# Patient Record
Sex: Female | Born: 1990 | Race: White | Hispanic: No | Smoking: Current some day smoker
Health system: Southern US, Academic
[De-identification: ages and names within clinical notes are randomized; demographics above are authoritative.]

## PROBLEM LIST (undated history)

## (undated) DIAGNOSIS — J020 Streptococcal pharyngitis: Secondary | ICD-10-CM

## (undated) DIAGNOSIS — F32A Depression, unspecified: Secondary | ICD-10-CM

## (undated) DIAGNOSIS — K589 Irritable bowel syndrome without diarrhea: Secondary | ICD-10-CM

## (undated) DIAGNOSIS — F329 Major depressive disorder, single episode, unspecified: Secondary | ICD-10-CM

## (undated) HISTORY — DX: Depression, unspecified: F32.A

## (undated) HISTORY — DX: Major depressive disorder, single episode, unspecified: F32.9

## (undated) HISTORY — PX: COLONOSCOPY: SHX174

## (undated) HISTORY — PX: CHOLECYSTECTOMY: SHX55

## (undated) HISTORY — DX: Irritable bowel syndrome, unspecified: K58.9

---
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
## 2010-12-15 ENCOUNTER — Emergency Department (HOSPITAL_COMMUNITY): Payer: Self-pay | Admitting: FAMILY PRACTICE

## 2014-08-13 ENCOUNTER — Ambulatory Visit (HOSPITAL_COMMUNITY): Payer: Self-pay | Admitting: Family

## 2014-08-17 ENCOUNTER — Other Ambulatory Visit: Payer: Self-pay

## 2014-08-18 LAB — HISTORICAL SURGICAL PATHOLOGY SPECIMEN

## 2015-02-06 ENCOUNTER — Emergency Department (HOSPITAL_COMMUNITY): Payer: Self-pay | Admitting: Emergency Medicine

## 2017-01-09 ENCOUNTER — Emergency Department
Admission: EM | Admit: 2017-01-09 | Discharge: 2017-01-09 | Disposition: A | Discharge status: Home / Self Care | Payer: Self-pay | Attending: Emergency Medicine | Admitting: Emergency Medicine

## 2017-01-09 ENCOUNTER — Encounter: Payer: Self-pay | Admitting: Emergency Medicine

## 2017-01-09 DIAGNOSIS — L089 Local infection of the skin and subcutaneous tissue, unspecified: Secondary | ICD-10-CM | POA: Insufficient documentation

## 2017-01-09 DIAGNOSIS — F17200 Nicotine dependence, unspecified, uncomplicated: Secondary | ICD-10-CM | POA: Insufficient documentation

## 2017-01-09 DIAGNOSIS — J069 Acute upper respiratory infection, unspecified: Secondary | ICD-10-CM | POA: Insufficient documentation

## 2017-01-09 MED ORDER — CEPHALEXIN 500 MG PO CAPS: 500.0000 mg | ORAL_CAPSULE | Freq: Three times a day (TID) | ORAL | 0 refills | Status: DC

## 2017-01-09 MED ORDER — PSEUDOEPH-BROMPHEN-DM 30-2-10 MG/5ML PO SYRP: 5.0000 mL | ORAL_SOLUTION | Freq: Four times a day (QID) | ORAL | 0 refills | Status: DC | PRN

## 2017-01-09 NOTE — ED Triage Notes (Signed)
Pt with cough for three days and all over body aches.

## 2017-01-09 NOTE — ED Notes (Signed)
See triage note  States she developed some body aches   Sore throat ,cough and fever/chills 3 days ago  Unsure of how high her fever was at home but low grade on arrival   Also states cough has been productive at times. States she took some Nyquil 2 days ago and was able to sleep but no relief with Tylenol sinus

## 2017-01-09 NOTE — ED Provider Notes (Signed)
[facility] Emergency Department Provider Note  ___________________________________________   First MD Initiated Contact with Patient 01/09/17 [phone]     (approximate)  I have reviewed the triage vital signs and the nursing notes.   HISTORY  Chief Complaint Cough   HPI Sara Berger is a 26 y.o. female is here with complaintof cough for the last 3 days and generalized body aches. Patient states that she has not actually taken her temperature at home but has felt feverish. She denies any nausea or vomiting. She has had some ear pain and nasal congestion. Cough is nonproductive. She is also here to be seen for her tattoo that is "infected". Evette Doffingattoo is 786 days old and has been draining. Patient also smokes daily.   History reviewed. No pertinent past medical history.  There are no active problems to display for this patient.   Past Surgical History:  Procedure Laterality Date  . CHOLECYSTECTOMY      Prior to Admission medications   Medication Sig Start Date End Date Taking? Authorizing Provider  brompheniramine-pseudoephedrine-DM 30-2-10 MG/5ML syrup Take 5 mLs by mouth 4 (four) times daily as needed. 01/09/17   Tommi RumpsSummers, Rhonda L, PA-C  cephALEXin (KEFLEX) 500 MG capsule Take 1 capsule (500 mg total) by mouth 3 (three) times daily. 01/09/17   Tommi RumpsSummers, Rhonda L, PA-C    Allergies Penicillins  No family history on file.  Social History Social History  Substance Use Topics  . Smoking status: Current Every Day Smoker  . Smokeless tobacco: Never Used  . Alcohol use No    Review of Systems Constitutional: Subjective fever/chills ENT: No sore throat. Positive nasal congestion. Cardiovascular: Denies chest pain. Respiratory: Denies shortness of breath. Positive nonproductive cough. Musculoskeletal: Negative for back pain. Skin: Skin infection left arm. Neurological: Negative for headaches, focal weakness or  numbness.   ____________________________________________   PHYSICAL EXAM:  VITAL SIGNS: ED Triage Vitals [01/09/17 0901]  Enc Vitals Group     BP      Pulse Rate 71     Resp 20     Temp 99 F (37.2 C)     Temp Source Oral     SpO2 100 %     Weight      Height      Head Circumference      Peak Flow      Pain Score      Pain Loc      Pain Edu?      Excl. in GC?    Constitutional: Alert and oriented. Well appearing and in no acute distress. Eyes: Conjunctivae are normal. Head: Atraumatic. Nose: Mild nasal congestion present. Mouth/Throat: Mucous membranes are moist.  Oropharynx non-erythematous. Mild posterior drainage noted. Neck: No stridor.   Hematological/Lymphatic/Immunilogical: No cervical lymphadenopathy. Cardiovascular: Normal rate, regular rhythm. Grossly normal heart sounds.  Good peripheral circulation. Respiratory: Normal respiratory effort.  No retractions. Lungs CTAB. Musculoskeletal: Moves upper and lower extremities without any difficulty. Normal gait was noted. Neurologic:  Normal speech and language. No gross focal neurologic deficits are appreciated.  Skin:  Skin is warm, dry.  Left arm medial aspect there is a tattoo present with 2 open areas that are draining.  No erythema or warmth was noted. Minimal tenderness. Psychiatric: Mood and affect are normal. Speech and behavior are normal.  ____________________________________________   LABS (all labs ordered are listed, but only abnormal results are displayed)  Labs Reviewed - No data to display  PROCEDURES  Procedure(s) performed: None  Procedures  Critical Care performed: No  ____________________________________________   INITIAL IMPRESSION / ASSESSMENT AND PLAN / ED COURSE  Pertinent labs & imaging results that were available during my care of the patient were reviewed by me and considered in my medical decision making (see chart for details).  Patient has taken Keflex before without  any difficulty or reactions. Patient was given a prescription for Keflex for her skin infection at her tattoo site. She is also given prescription for Bromfed-DM as needed for cough and congestion. She is to increase fluids. She was encouraged to decrease smoking. She is to [facility] if any continued problems.      ____________________________________________   FINAL CLINICAL IMPRESSION(S) / ED DIAGNOSES  Final diagnoses:  Acute upper respiratory infection  Skin infection      NEW MEDICATIONS STARTED DURING THIS VISIT:  Discharge Medication List as of 01/09/2017  9:30 AM    START taking these medications   Details  brompheniramine-pseudoephedrine-DM 30-2-10 MG/5ML syrup Take 5 mLs by mouth 4 (four) times daily as needed., Starting Tue 01/09/2017, Print    cephALEXin (KEFLEX) 500 MG capsule Take 1 capsule (500 mg total) by mouth 3 (three) times daily., Starting Tue 01/09/2017, Print         Note:  This document was prepared using Dragon voice recognition software and may include unintentional dictation errors.    Tommi RumpsSummers, Rhonda L, PA-C 01/09/17 1054    Jeanmarie PlantMcShane, James A, MD 01/09/17 [phone]

## 2017-01-09 NOTE — ED Notes (Signed)
First Nurse Note:  Patient given mask and box of tissues.

## 2017-01-09 NOTE — Discharge Instructions (Signed)
You may also apply a small amount of Neosporin to the tattoo area twice a day. Begin taking Bromfed DM as directed for cough and congestion. Increase fluids. Decrease smoking. Begin taking Keflex 3 times a day for 7 days. May take Tylenol or ibuprofen if needed for pain. [facility] if any continued problems.

## 2017-06-20 ENCOUNTER — Emergency Department
Admission: EM | Admit: 2017-06-20 | Discharge: 2017-06-20 | Disposition: A | Discharge status: Home / Self Care | Payer: Self-pay | Attending: Emergency Medicine | Admitting: Emergency Medicine

## 2017-06-20 ENCOUNTER — Encounter: Payer: Self-pay | Admitting: Emergency Medicine

## 2017-06-20 DIAGNOSIS — R300 Dysuria: Secondary | ICD-10-CM | POA: Insufficient documentation

## 2017-06-20 DIAGNOSIS — F17200 Nicotine dependence, unspecified, uncomplicated: Secondary | ICD-10-CM | POA: Insufficient documentation

## 2017-06-20 DIAGNOSIS — M5441 Lumbago with sciatica, right side: Secondary | ICD-10-CM | POA: Insufficient documentation

## 2017-06-20 DIAGNOSIS — M5431 Sciatica, right side: Secondary | ICD-10-CM

## 2017-06-20 LAB — CBC
HCT: 38.9 % (ref 35.0–47.0)
Hemoglobin: 13.3 g/dL (ref 12.0–16.0)
MCH: 32.4 pg (ref 26.0–34.0)
MCHC: 34.3 g/dL (ref 32.0–36.0)
MCV: 94.6 fL (ref 80.0–100.0)
PLATELETS: 299 10*3/uL (ref 150–440)
RBC: 4.11 MIL/uL (ref 3.80–5.20)
RDW: 12.2 % (ref 11.5–14.5)
WBC: 8.9 10*3/uL (ref 3.6–11.0)

## 2017-06-20 LAB — COMPREHENSIVE METABOLIC PANEL
ALK PHOS: 66 U/L (ref 38–126)
ALT: 12 U/L — AB (ref 14–54)
AST: 20 U/L (ref 15–41)
Albumin: 4.5 g/dL (ref 3.5–5.0)
Anion gap: 6 (ref 5–15)
BUN: 14 mg/dL (ref 6–20)
CHLORIDE: 107 mmol/L (ref 101–111)
CO2: 26 mmol/L (ref 22–32)
CREATININE: 0.68 mg/dL (ref 0.44–1.00)
Calcium: 9.2 mg/dL (ref 8.9–10.3)
GFR calc Af Amer: >60 mL/min (ref >60)
GFR calc non Af Amer: >60 mL/min (ref >60)
Glucose, Bld: 91 mg/dL (ref 65–99)
Potassium: 3.7 mmol/L (ref 3.5–5.1)
Sodium: 139 mmol/L (ref 135–145)
Total Bilirubin: 1.4 mg/dL — ABNORMAL HIGH (ref 0.3–1.2)
Total Protein: 7.4 g/dL (ref 6.5–8.1)

## 2017-06-20 LAB — URINALYSIS, COMPLETE (UACMP) WITH MICROSCOPIC
Bacteria, UA: NONE SEEN
Bilirubin Urine: NEGATIVE
Glucose, UA: NEGATIVE mg/dL
Hgb urine dipstick: NEGATIVE
Ketones, ur: NEGATIVE mg/dL
Leukocytes, UA: NEGATIVE
Nitrite: NEGATIVE
PH: 6 (ref 5.0–8.0)
Protein, ur: NEGATIVE mg/dL
SPECIFIC GRAVITY, URINE: 1.017 (ref 1.005–1.030)

## 2017-06-20 LAB — LIPASE, BLOOD: LIPASE: 30 U/L (ref 11–51)

## 2017-06-20 LAB — POCT PREGNANCY, URINE: Preg Test, Ur: NEGATIVE

## 2017-06-20 MED ORDER — PREDNISONE 10 MG (21) PO TBPK: ORAL_TABLET | ORAL | 0 refills | Status: DC

## 2017-06-20 MED ORDER — PHENAZOPYRIDINE HCL 100 MG PO TABS: 100.0000 mg | ORAL_TABLET | Freq: Three times a day (TID) | ORAL | 0 refills | Status: AC | PRN

## 2017-06-20 NOTE — ED Provider Notes (Signed)
[facility] Emergency Department Provider Note   ____________________________________________   I have reviewed the triage vital signs and the nursing notes.   HISTORY  Chief Complaint Dysuria and Back Pain   History limited by: Not Limited   HPI Sara Berger is a 27 y.o. female who presents to the emergency department today with primary complain for dysuria.  DURATION:roughly 2 weeks TIMING: constant SEVERITY: severe QUALITY: pain CONTEXT: patient states she has a history of kidney stones. States that this pain feels similar to her. Was hoping that she would pass it and it would go away but the pain persisted. Pain actually better today. Was more severe yesterday. MODIFYING FACTORS: none ASSOCIATED SYMPTOMS: right low back pain. Denies any fevers. Denies bad odor to her urine.   Per medical record review patient has a history of being seen in the emergency department for URI.  History reviewed. No pertinent past medical history.  There are no active problems to display for this patient.   Past Surgical History:  Procedure Laterality Date  . CHOLECYSTECTOMY      Prior to Admission medications   Medication Sig Start Date End Date Taking? Authorizing Provider  brompheniramine-pseudoephedrine-DM 30-2-10 MG/5ML syrup Take 5 mLs by mouth 4 (four) times daily as needed. 01/09/17   Tommi RumpsSummers, Rhonda L, PA-C  cephALEXin (KEFLEX) 500 MG capsule Take 1 capsule (500 mg total) by mouth 3 (three) times daily. 01/09/17   Tommi RumpsSummers, Rhonda L, PA-C    Allergies Penicillins  History reviewed. No pertinent family history.  Social History Social History   Tobacco Use  . Smoking status: Current Every Day Smoker  . Smokeless tobacco: Never Used  Substance Use Topics  . Alcohol use: No  . Drug use: No    Review of Systems Constitutional: No fever/chills Eyes: No visual changes. ENT: No sore throat. Cardiovascular: Denies chest pain. Respiratory:  Denies shortness of breath. Gastrointestinal: No abdominal pain.  No nausea, no vomiting.  No diarrhea.   Genitourinary: Positive for dysuria. Musculoskeletal: Positive for right lower back pain. Skin: Negative for rash. Neurological: Negative for headaches, focal weakness or numbness.  ____________________________________________   PHYSICAL EXAM:  VITAL SIGNS: ED Triage Vitals  Enc Vitals Group     BP 06/20/17 2022 124/68     Pulse Rate 06/20/17 2022 68     Resp 06/20/17 2022 17     Temp 06/20/17 2022 97.9 F (36.6 C)     Temp Source 06/20/17 2022 Oral     SpO2 06/20/17 2022 100 %     Weight 06/20/17 2023 125 lb (56.7 kg)     Height --      Head Circumference --      Peak Flow --      Pain Score 06/20/17 2038 8   Constitutional: Alert and oriented. Well appearing and in no distress. Eyes: Conjunctivae are normal.  ENT   Head: Normocephalic and atraumatic.   Nose: No congestion/rhinnorhea.   Mouth/Throat: Mucous membranes are moist.   Neck: No stridor. Hematological/Lymphatic/Immunilogical: No cervical lymphadenopathy. Cardiovascular: Normal rate, regular rhythm.  No murmurs, rubs, or gallops.  Respiratory: Normal respiratory effort without tachypnea nor retractions. Breath sounds are clear and equal bilaterally. No wheezes/rales/rhonchi. Gastrointestinal: Soft and non tender. No rebound. No guarding.  Genitourinary: Deferred Musculoskeletal: Normal range of motion in all extremities. No lower extremity edema. Tender to palpation over the right SI joint.  Neurologic:  Normal speech and language. No gross focal neurologic deficits are appreciated.  Skin:  Skin is warm, dry and intact. No rash noted. Psychiatric: Mood and affect are normal. Speech and behavior are normal. Patient exhibits appropriate insight and judgment.  ____________________________________________    LABS (pertinent positives/negatives)  upreg negative Lipase 30 cmp wnl except alt 12,  t bili 1.4 CBC wnl  UA not consistent with infection ____________________________________________   EKG  None  ____________________________________________    RADIOLOGY  None  ____________________________________________   PROCEDURES  Procedures  ____________________________________________   INITIAL IMPRESSION / ASSESSMENT AND PLAN / ED COURSE  Pertinent labs & imaging results that were available during my care of the patient were reviewed by me and considered in my medical decision making (see chart for details).  Patient presents to the emergency department today with primary concern for dysuria.  Patient states that she has history of kidney stones.  Actually states that the pain is somewhat better today so I do wonder if perhaps she is ready passed kidney stone and now she is having some residual discomfort.  No obvious signs of infection in the urine.  Blood work without concerning findings.  She is somewhat tender over her SI/sciatica.  Will plan on giving patient Pyridium for the urinary complaints and steroids for the back pain.  Will give patient primary care follow-up.  Discussed plan and findings with patient.  ____________________________________________   FINAL CLINICAL IMPRESSION(S) / ED DIAGNOSES  Final diagnoses:  Dysuria  Sciatica of right side     Note: This dictation was prepared with Dragon dictation. Any transcriptional errors that result from this process are unintentional     Phineas Semen, MD 06/20/17 2242

## 2017-06-20 NOTE — ED Notes (Signed)
Pt c/o lower back pain - pt states it feels like a pinched nerve - pain is located on top of right buttocks Pt also reports hx of kidney stone and she feels like she has another kidney stone - reports the pain is like "peeing razor blades" - pt reports she has had this pain for 3 weeks - pain is on left side and radiates into lower abd

## 2017-06-20 NOTE — ED Triage Notes (Signed)
Pt c/o urinary frequency, dysuria and lower abdominal pain as well as pain in lower right back, x3 weeks.

## 2017-06-20 NOTE — Discharge Instructions (Signed)
Please seek medical attention for any high fevers, chest pain, shortness of breath, change in behavior, persistent vomiting, bloody stool or any other new or concerning symptoms.  

## 2017-06-22 LAB — URINE CULTURE

## 2017-07-28 ENCOUNTER — Other Ambulatory Visit: Payer: Self-pay

## 2017-07-28 ENCOUNTER — Emergency Department
Admission: EM | Admit: 2017-07-28 | Discharge: 2017-07-28 | Disposition: A | Discharge status: Home / Self Care | Payer: Self-pay | Attending: Student in an Organized Health Care Education/Training Program | Admitting: Student in an Organized Health Care Education/Training Program

## 2017-07-28 ENCOUNTER — Encounter: Payer: Self-pay | Admitting: Emergency Medicine

## 2017-07-28 DIAGNOSIS — Z79899 Other long term (current) drug therapy: Secondary | ICD-10-CM | POA: Insufficient documentation

## 2017-07-28 DIAGNOSIS — F17210 Nicotine dependence, cigarettes, uncomplicated: Secondary | ICD-10-CM | POA: Insufficient documentation

## 2017-07-28 DIAGNOSIS — M5431 Sciatica, right side: Secondary | ICD-10-CM | POA: Insufficient documentation

## 2017-07-28 MED ORDER — CYCLOBENZAPRINE HCL 5 MG PO TABS: 5.0000 mg | ORAL_TABLET | Freq: Three times a day (TID) | ORAL | 0 refills | Status: AC | PRN

## 2017-07-28 MED ORDER — CYCLOBENZAPRINE HCL 10 MG PO TABS
10.0000 mg | ORAL_TABLET | Freq: Once | ORAL | Status: AC
  Administered 2017-07-28: 10 mg via ORAL
  Filled 2017-07-28: qty 1

## 2017-07-28 MED ORDER — KETOROLAC TROMETHAMINE 30 MG/ML IJ SOLN
30.0000 mg | Freq: Once | INTRAMUSCULAR | Status: AC
  Administered 2017-07-28: 30 mg via INTRAMUSCULAR
  Filled 2017-07-28: qty 1

## 2017-07-28 MED ORDER — PREDNISONE 50 MG PO TABS: ORAL_TABLET | ORAL | 0 refills | Status: DC

## 2017-07-28 NOTE — ED Provider Notes (Signed)
[facility] Emergency Department Provider Note  ____________________________________________  Time seen: Approximately 6:15 PM  I have reviewed the triage vital signs and the nursing notes.   HISTORY  Chief Complaint Hip Pain    HPI Sara Berger is a 27 y.o. female presents to the emergency department with radiculopathy of the right lower extremity that patient has experienced intermittently for the past 2 months.  Patient reports that she previously tried tapered prednisone, which provided her with momentary relief.  Patient has not been exercising daily or performing recommended stretches.  She denies weakness or bowel or bladder incontinence.  Patient has been able to ambulate with some difficulty.  Patient symptoms are not keeping her from work.  She rates her pain at 10 out of 10 in intensity.  Patient has been taking ibuprofen 800 daily.   History reviewed. No pertinent past medical history.  There are no active problems to display for this patient.   Past Surgical History:  Procedure Laterality Date  . CHOLECYSTECTOMY      Prior to Admission medications   Medication Sig Start Date End Date Taking? Authorizing Provider  brompheniramine-pseudoephedrine-DM 30-2-10 MG/5ML syrup Take 5 mLs by mouth 4 (four) times daily as needed. 01/09/17   Tommi Rumps, PA-C  cephALEXin (KEFLEX) 500 MG capsule Take 1 capsule (500 mg total) by mouth 3 (three) times daily. 01/09/17   Tommi Rumps, PA-C  cyclobenzaprine (FLEXERIL) 5 MG tablet Take 1 tablet (5 mg total) by mouth 3 (three) times daily as needed for up to 3 days for muscle spasms. 07/28/17 07/31/17  Orvil Feil, PA-C  phenazopyridine (PYRIDIUM) 100 MG tablet Take 1 tablet (100 mg total) by mouth 3 (three) times daily as needed for pain. 06/20/17 06/20/18  Phineas Semen, MD  predniSONE (DELTASONE) 50 MG tablet Take one 50 mg tablet once daily for 5 days. 07/28/17   Orvil Feil, PA-C     Allergies Penicillins  No family history on file.  Social History Social History   Tobacco Use  . Smoking status: Current Every Day Smoker  . Smokeless tobacco: Never Used  Substance Use Topics  . Alcohol use: No  . Drug use: No     Review of Systems  Constitutional: No fever/chills Eyes: No visual changes. No discharge ENT: No upper respiratory complaints. Cardiovascular: no chest pain. Respiratory: no cough. No SOB. Gastrointestinal: No abdominal pain.  No nausea, no vomiting.  No diarrhea.  No constipation. Musculoskeletal: Negative for musculoskeletal pain. Skin: Negative for rash, abrasions, lacerations, ecchymosis. Neurological: Patient has radiculopathy of right lower extremity.   ____________________________________________   PHYSICAL EXAM:  VITAL SIGNS: ED Triage Vitals  Enc Vitals Group     BP 07/28/17 1609 (!) 119/56     Pulse Rate 07/28/17 1609 67     Resp 07/28/17 1609 18     Temp 07/28/17 1609 98.8 F (37.1 C)     Temp Source 07/28/17 1609 Oral     SpO2 07/28/17 1609 100 %     Weight 07/28/17 1612 130 lb (59 kg)     Height 07/28/17 1612 5\' 5"  (1.651 m)     Head Circumference --      Peak Flow --      Pain Score 07/28/17 1612 8     Pain Loc --      Pain Edu? --      Excl. in GC? --      Constitutional: Alert and oriented. Well appearing and in  no acute distress. Eyes: Conjunctivae are normal. PERRL. EOMI. Head: Atraumatic. Cardiovascular: Normal rate, regular rhythm. Normal S1 and S2.  Good peripheral circulation. Respiratory: Normal respiratory effort without tachypnea or retractions. Lungs CTAB. Good air entry to the bases with no decreased or absent breath sounds. Gastrointestinal: Bowel sounds 4 quadrants. Soft and nontender to palpation. No guarding or rigidity. No palpable masses. No distention. No CVA tenderness. Musculoskeletal: Full range of motion to all extremities. No gross deformities appreciated.  Positive straight leg  raise, right. Neurologic:  Normal speech and language. No gross focal neurologic deficits are appreciated.  Skin:  Skin is warm, dry and intact. No rash noted. Psychiatric: Mood and affect are normal. Speech and behavior are normal. Patient exhibits appropriate insight and judgement.   ____________________________________________   LABS (all labs ordered are listed, but only abnormal results are displayed)  Labs Reviewed - No data to display ____________________________________________  EKG   ____________________________________________  RADIOLOGY   No results found.  ____________________________________________    PROCEDURES  Procedure(s) performed:    Procedures    Medications  ketorolac (TORADOL) 30 MG/ML injection 30 mg (30 mg Intramuscular Given 07/28/17 1801)  cyclobenzaprine (FLEXERIL) tablet 10 mg (10 mg Oral Given 07/28/17 1800)     ____________________________________________   INITIAL IMPRESSION / ASSESSMENT AND PLAN / ED COURSE  Pertinent labs & imaging results that were available during my care of the patient were reviewed by me and considered in my medical decision making (see chart for details).  Review of the [place] CSRS was performed in accordance of the [facility] prior to dispensing any controlled drugs.     Assessment and plan Radiculopathy Sciatica Differential diagnosis originally included sciatica, radiculopathy, lumbar strain and herniated disc.  Patient had a positive straight leg raise test on physical exam.  Patient was given Toradol and Flexeril in the emergency department.  I believe that patient did not receive a therapeutic dose of prednisone.  Patient was discharged with prednisone at 50 mg for the next 5 days.  Patient was advised to walk daily and to perform daily stretches and/or a weekly yoga class.  Patient voiced understanding.  Patient was advised to follow-up with primary care as needed.  All patient questions were  answered.    ____________________________________________  FINAL CLINICAL IMPRESSION(S) / ED DIAGNOSES  Final diagnoses:  Sciatica of right side      NEW MEDICATIONS STARTED DURING THIS VISIT:  ED Discharge Orders        Ordered    predniSONE (DELTASONE) 50 MG tablet     07/28/17 1744    cyclobenzaprine (FLEXERIL) 5 MG tablet  3 times daily PRN     07/28/17 1746          This chart was dictated using voice recognition software/Dragon. Despite best efforts to proofread, errors can occur which can change the meaning. Any change was purely unintentional.    Orvil FeilWoods, Ameena Vesey M, PA-C 07/28/17 1818    Willy Eddyobinson, Patrick, MD 07/28/17 [phone]

## 2017-07-28 NOTE — ED Triage Notes (Signed)
R hip pain x 2 months. Worse in past week. Previously diagnosed as sciatica. Denies injury.

## 2017-07-28 NOTE — ED Notes (Signed)

## 2018-06-30 ENCOUNTER — Other Ambulatory Visit: Payer: Self-pay

## 2018-06-30 ENCOUNTER — Emergency Department
Admission: EM | Admit: 2018-06-30 | Discharge: 2018-06-30 | Disposition: A | Discharge status: Home / Self Care | Payer: Self-pay | Attending: Emergency Medicine | Admitting: Emergency Medicine

## 2018-06-30 ENCOUNTER — Emergency Department: Payer: Self-pay

## 2018-06-30 DIAGNOSIS — Z87891 Personal history of nicotine dependence: Secondary | ICD-10-CM | POA: Insufficient documentation

## 2018-06-30 DIAGNOSIS — Z79899 Other long term (current) drug therapy: Secondary | ICD-10-CM | POA: Insufficient documentation

## 2018-06-30 DIAGNOSIS — N83202 Unspecified ovarian cyst, left side: Secondary | ICD-10-CM | POA: Insufficient documentation

## 2018-06-30 DIAGNOSIS — R1010 Upper abdominal pain, unspecified: Secondary | ICD-10-CM

## 2018-06-30 DIAGNOSIS — N2889 Other specified disorders of kidney and ureter: Secondary | ICD-10-CM | POA: Insufficient documentation

## 2018-06-30 LAB — URINALYSIS, COMPLETE (UACMP) WITH MICROSCOPIC
BACTERIA UA: NONE SEEN
BILIRUBIN URINE: NEGATIVE
Glucose, UA: NEGATIVE mg/dL
Hgb urine dipstick: NEGATIVE
Ketones, ur: 80 mg/dL — AB
Leukocytes, UA: NEGATIVE
Nitrite: NEGATIVE
Protein, ur: NEGATIVE mg/dL
SPECIFIC GRAVITY, URINE: 1.023 (ref 1.005–1.030)
pH: 6 (ref 5.0–8.0)

## 2018-06-30 LAB — CBC
HCT: 38.5 % (ref 36.0–46.0)
Hemoglobin: 13.2 g/dL (ref 12.0–15.0)
MCH: 32.2 pg (ref 26.0–34.0)
MCHC: 34.3 g/dL (ref 30.0–36.0)
MCV: 93.9 fL (ref 80.0–100.0)
Platelets: 308 10*3/uL (ref 150–400)
RBC: 4.1 MIL/uL (ref 3.87–5.11)
RDW: 11.9 % (ref 11.5–15.5)
WBC: 7.6 10*3/uL (ref 4.0–10.5)
nRBC: 0 % (ref 0.0–0.2)

## 2018-06-30 LAB — COMPREHENSIVE METABOLIC PANEL
ALT: 11 U/L (ref 0–44)
AST: 17 U/L (ref 15–41)
Albumin: 4.6 g/dL (ref 3.5–5.0)
Alkaline Phosphatase: 62 U/L (ref 38–126)
Anion gap: 7 (ref 5–15)
BUN: 9 mg/dL (ref 6–20)
CHLORIDE: 109 mmol/L (ref 98–111)
CO2: 23 mmol/L (ref 22–32)
CREATININE: 0.55 mg/dL (ref 0.44–1.00)
Calcium: 9.1 mg/dL (ref 8.9–10.3)
GFR calc Af Amer: >60 mL/min (ref >60)
GLUCOSE: 94 mg/dL (ref 70–99)
Potassium: 3.7 mmol/L (ref 3.5–5.1)
SODIUM: 139 mmol/L (ref 135–145)
Total Bilirubin: 3.2 mg/dL — ABNORMAL HIGH (ref 0.3–1.2)
Total Protein: 7.3 g/dL (ref 6.5–8.1)

## 2018-06-30 LAB — LIPASE, BLOOD: LIPASE: 24 U/L (ref 11–51)

## 2018-06-30 LAB — POCT PREGNANCY, URINE: PREG TEST UR: NEGATIVE

## 2018-06-30 MED ORDER — SODIUM CHLORIDE 0.9 % IV BOLUS
1000.0000 mL | Freq: Once | INTRAVENOUS | Status: AC
  Administered 2018-06-30: 1000 mL via INTRAVENOUS

## 2018-06-30 MED ORDER — ONDANSETRON HCL 4 MG/2ML IJ SOLN
4.0000 mg | Freq: Once | INTRAMUSCULAR | Status: AC
  Administered 2018-06-30: 4 mg via INTRAVENOUS
  Filled 2018-06-30: qty 2

## 2018-06-30 MED ORDER — SODIUM CHLORIDE 0.9% FLUSH: 3.0000 mL | Freq: Once | INTRAVENOUS | Status: DC

## 2018-06-30 MED ORDER — IOHEXOL 300 MG/ML  SOLN
100.0000 mL | Freq: Once | INTRAMUSCULAR | Status: AC | PRN
  Administered 2018-06-30: 100 mL via INTRAVENOUS

## 2018-06-30 MED ORDER — PROMETHAZINE HCL 25 MG/ML IJ SOLN
12.5000 mg | Freq: Once | INTRAMUSCULAR | Status: AC
  Administered 2018-06-30: 12.5 mg via INTRAVENOUS
  Filled 2018-06-30: qty 1

## 2018-06-30 MED ORDER — OMEPRAZOLE 40 MG PO CPDR: 40.0000 mg | DELAYED_RELEASE_CAPSULE | Freq: Every day | ORAL | 0 refills | Status: DC

## 2018-06-30 MED ORDER — LIDOCAINE VISCOUS HCL 2 % MT SOLN
15.0000 mL | Freq: Once | OROMUCOSAL | Status: AC
Start: 2018-06-30 — End: 2018-06-30
  Administered 2018-06-30: 15 mL via OROMUCOSAL
  Filled 2018-06-30: qty 15

## 2018-06-30 MED ORDER — ALUM & MAG HYDROXIDE-SIMETH 200-200-20 MG/5ML PO SUSP
30.0000 mL | Freq: Once | ORAL | Status: AC
  Administered 2018-06-30: 30 mL via ORAL
  Filled 2018-06-30: qty 30

## 2018-06-30 NOTE — ED Provider Notes (Signed)
[facility] Emergency Department Provider Note  ____________________________________________   First MD Initiated Contact with Patient 06/30/18 1237     (approximate)  I have reviewed the triage vital signs and the nursing notes.   HISTORY  Chief Complaint Abdominal Pain   HPI Sara Berger is a 28 y.o. female with a history of cholecystectomy as well as gastric ulcers who was presented emergency department 2 to 3 days of epigastric abdominal pain which he describes a burning type pain.  Says that it is associated with nausea and vomiting.  Denies any diarrhea or abdominal distention.  States that she is unable to keep food down for approximately 10 minutes before vomiting.  Says has tried Pepto-Bismol without relief.   History reviewed. No pertinent past medical history.  There are no active problems to display for this patient.   Past Surgical History:  Procedure Laterality Date  . CHOLECYSTECTOMY    . COLONOSCOPY      Prior to Admission medications   Medication Sig Start Date End Date Taking? Authorizing Provider  lamoTRIgine (LAMICTAL) 25 MG tablet Take 25 mg by mouth 2 (two) times daily.  05/27/18  Yes [provider]    Allergies Penicillins  History reviewed. No pertinent family history.  Social History Social History   Tobacco Use  . Smoking status: Former Games developermoker  . Smokeless tobacco: Never Used  Substance Use Topics  . Alcohol use: Yes  . Drug use: No    Review of Systems  Constitutional: No fever/chills Eyes: No visual changes. ENT: No sore throat. Cardiovascular: Denies chest pain. Respiratory: Denies shortness of breath. Gastrointestinal:  No diarrhea.  No constipation. Genitourinary: Negative for dysuria.  Denies any vaginal discharge or bleeding. Musculoskeletal: Negative for back pain. Skin: Negative for rash. Neurological: Negative for headaches, focal weakness or  numbness.   ____________________________________________   PHYSICAL EXAM:  VITAL SIGNS: ED Triage Vitals  Enc Vitals Group     BP 06/30/18 1137 99/66     Pulse Rate 06/30/18 1137 81     Resp 06/30/18 1137 14     Temp 06/30/18 1137 98.6 F (37 C)     Temp Source 06/30/18 1137 Oral     SpO2 06/30/18 1137 100 %     Weight 06/30/18 1137 130 lb (59 kg)     Height 06/30/18 1137 5\' 5"  (1.651 m)     Head Circumference --      Peak Flow --      Pain Score 06/30/18 1139 4     Pain Loc --      Pain Edu? --      Excl. in GC? --     Constitutional: Alert and oriented. Well appearing and in no acute distress. Eyes: Conjunctivae are normal.  Head: Atraumatic. Nose: No congestion/rhinnorhea. Mouth/Throat: Mucous membranes are moist.  Neck: No stridor.   Cardiovascular: Normal rate, regular rhythm. Grossly normal heart sounds.   Respiratory: Normal respiratory effort.  No retractions. Lungs CTAB. Gastrointestinal: Soft with moderate epigastric as well as left lower quadrant tenderness to palpation.  No lower abdominal tenderness.  No distention. No CVA tenderness. Musculoskeletal: No lower extremity tenderness nor edema.  No joint effusions. Neurologic:  Normal speech and language. No gross focal neurologic deficits are appreciated. Skin:  Skin is warm, dry and intact. No rash noted. Psychiatric: Mood and affect are normal. Speech and behavior are normal.  ____________________________________________   LABS (all labs ordered are listed, but only abnormal results are displayed)  Labs Reviewed  COMPREHENSIVE METABOLIC PANEL - Abnormal; Notable for the following components:      Result Value   Total Bilirubin 3.2 (*)    All other components within normal limits  URINALYSIS, COMPLETE (UACMP) WITH MICROSCOPIC - Abnormal; Notable for the following components:   Color, Urine YELLOW (*)    APPearance CLEAR (*)    Ketones, ur 80 (*)    All other components within normal limits  LIPASE,  BLOOD  CBC  POC URINE PREG, ED  POCT PREGNANCY, URINE   ____________________________________________  EKG   ____________________________________________  RADIOLOGY  CT the abdomen and pelvis with a small amount of loculated fluid in the cul-de-sac.  Suspect recent ovarian cyst rupture.  Sigmoid diverticulosis without diverticulitis.  Decrease attenuation mass arising the lower pole the right kidney.  1.9 x 1.3 cm.  Suspect mildly complex cyst.  Will require further monitoring. ____________________________________________   PROCEDURES  Procedure(s) performed:   Procedures  Critical Care performed:   ____________________________________________   INITIAL IMPRESSION / ASSESSMENT AND PLAN / ED COURSE  Pertinent labs & imaging results that were available during my care of the patient were reviewed by me and considered in my medical decision making (see chart for details).  Differential diagnosis includes, but is not limited to, biliary disease (biliary colic, acute cholecystitis, cholangitis, choledocholithiasis, etc), intrathoracic causes for epigastric abdominal pain including ACS, gastritis, duodenitis, pancreatitis, small bowel or large bowel obstruction, abdominal aortic aneurysm, hernia, and ulcer(s). As part of my medical decision making, I reviewed the following data within the electronic MEDICAL RECORD NUMBER Notes from prior ED visits  ----------------------------------------- 2:38 PM on 06/30/2018 -----------------------------------------  Ordered CT because of history of cholecystitis as well as increased bilirubin.  However, no obvious source of increased bilirubin/obstruction on the CT scan.  Patient aware of left likely dominant follicle as well as right kidney mass and will follow up with primary care as well as OB/GYN.  Requesting more nausea medication at this time.  We will also try GI cocktail.  Signed out to Dr. Alphonzo Lemmings.  Likely disposition home as long as patient  is feeling improved. ____________________________________________   FINAL CLINICAL IMPRESSION(S) / ED DIAGNOSES  Upper abdominal pain.  Ovarian cyst.  Kidney mass.  NEW MEDICATIONS STARTED DURING THIS VISIT:  New Prescriptions   No medications on file     Note:  This document was prepared using Dragon voice recognition software and may include unintentional dictation errors.     Myrna Blazer, MD 06/30/18 [phone]

## 2018-06-30 NOTE — ED Notes (Signed)
Patient transported to CT 

## 2018-06-30 NOTE — ED Provider Notes (Signed)
-----------------------------------------   3:50 PM on 06/30/2018 ----------------------------------------- This patient was signed out to me by Dr. Pershing Proud 15 minutes ago, his plan was to discharge the patient if she tolerated p.o.  He has her discharge ready.  He does not feel any further intervention is required in this department.  Patient has tolerated p.o. with no difficulty she has a nonsurgical abdomen and he is made aware of all CT findings and his plan for follow-up.  Given that this is all taken care of prior to my arrival, we have ensured the patient is tolerating p.o.     Jeanmarie Plant, MD 06/30/18 [phone]

## 2018-06-30 NOTE — ED Notes (Signed)
Pt reports vomiting but has the same emesis bag as previously and no emesis in the bag

## 2018-06-30 NOTE — ED Notes (Signed)
Pt c/o nausea and vomiting x2 days (unable to count the number of times), c/o generalized body aches and malaise - denies fever Reports cough and runny nose x2 weeks Refused IV start until after provider sees her

## 2018-06-30 NOTE — ED Triage Notes (Signed)
N&V&D and Left/medial abd pain since Friday. States had ulcers when she was young. Feels similar. A&O, ambulatory. Has had gallbladder removed.

## 2018-06-30 NOTE — ED Notes (Signed)
83% O2 Sat incorrect and entered in error

## 2018-07-03 ENCOUNTER — Encounter: Payer: Self-pay | Admitting: Obstetrics and Gynecology

## 2018-07-03 ENCOUNTER — Ambulatory Visit: Payer: Self-pay | Admitting: Obstetrics and Gynecology

## 2018-07-03 ENCOUNTER — Ambulatory Visit (INDEPENDENT_AMBULATORY_CARE_PROVIDER_SITE_OTHER): Payer: Self-pay | Admitting: Obstetrics and Gynecology

## 2018-07-03 VITALS — BP 124/74 | Ht 65.0 in | Wt 130.0 lb

## 2018-07-03 DIAGNOSIS — R1032 Left lower quadrant pain: Secondary | ICD-10-CM

## 2018-07-03 DIAGNOSIS — K279 Peptic ulcer, site unspecified, unspecified as acute or chronic, without hemorrhage or perforation: Secondary | ICD-10-CM

## 2018-07-03 DIAGNOSIS — N83202 Unspecified ovarian cyst, left side: Secondary | ICD-10-CM

## 2018-07-03 MED ORDER — OMEPRAZOLE 40 MG PO CPDR: 40.0000 mg | DELAYED_RELEASE_CAPSULE | Freq: Every day | ORAL | 0 refills | Status: DC

## 2018-07-03 NOTE — Progress Notes (Signed)
Obstetrics & Gynecology Office Visit   Chief Complaint  Patient presents with  . Follow-up  . Ovarian Cyst    History of Present Illness: 28 y.o. female who presented to the ER three days ago for a cyst noted on her ovary during workup.  Her pain was located in the epigastric area with nausea and vomiting.  On CT scan she was noted to have left ovarian dominant follicle.  She was also noted to have some fluid and the idea that a ruptured ovarian cyst was suspected.  She has IBS and notes several months of pain in her left lower quadrant.  The LLQ pain is constant (the uncomfortable pain). The more severe pain comes and goes.  The pain is more severe during that time.  Her menses are associated with excruciating pain.  diverticuli were noted on her CT, as well.  [place] pap [place] and is reported as normal.  She states she has a history of herpes, possibly. She has never followed up on it.     Past Medical History:  Diagnosis Date  . Depression   . IBS (irritable bowel syndrome)     Past Surgical History:  Procedure Laterality Date  . CHOLECYSTECTOMY    . COLONOSCOPY      Gynecologic History: Patient's last menstrual period was 05/28/2018.  Obstetric History:  G0  Family History  Problem Relation Age of Onset  . Breast cancer Maternal Grandmother   . Colon cancer Maternal Grandfather   . Diabetes Mother   . Congestive Heart Failure Mother   . Heart attack Mother        5 heart attacks within 13 months  . Diabetes Maternal Uncle   . Heart attack Maternal Uncle [id number]    Social History   Socioeconomic History  . Marital status: Single    Spouse name: Not on file  . Number of children: Not on file  . Years of education: Not on file  . Highest education level: Not on file  Occupational History  . Not on file  Social Needs  . Financial resource strain: Not on file  . Food insecurity:    Worry: Not on file    Inability: Not on file    . Transportation needs:    Medical: Not on file    Non-medical: Not on file  Tobacco Use  . Smoking status: Former Games developermoker  . Smokeless tobacco: Never Used  Substance and Sexual Activity  . Alcohol use: Yes  . Drug use: No  . Sexual activity: Yes    Birth control/protection: None  Lifestyle  . Physical activity:    Days per week: Not on file    Minutes per session: Not on file  . Stress: Not on file  Relationships  . Social connections:    Talks on phone: Not on file    Gets together: Not on file    Attends religious service: Not on file    Active member of club or organization: Not on file    Attends meetings of clubs or organizations: Not on file    Relationship status: Not on file  . Intimate partner violence:    Fear of current or ex partner: Not on file    Emotionally abused: Not on file    Physically abused: Not on file    Forced sexual activity: Not on file  Other Topics Concern  . Not on file  Social History Narrative  .  Not on file    Allergies  Allergen Reactions  . Penicillins     Prior to Admission medications   Medication Sig Start Date End Date Taking? Authorizing Provider  lamoTRIgine (LAMICTAL) 25 MG tablet Take 25 mg by mouth 2 (two) times daily.  05/27/18   [provider]  omeprazole (PRILOSEC) 40 MG capsule Take 1 capsule (40 mg total) by mouth daily. 06/30/18 06/30/19  Myrna Blazer, MD    Review of Systems  Constitutional: Negative.   HENT: Negative.   Eyes: Negative.   Respiratory: Negative.   Cardiovascular: Negative.   Gastrointestinal: Positive for abdominal pain (see HPI). Negative for blood in stool, constipation, diarrhea, heartburn, melena, nausea and vomiting.  Genitourinary: Negative.   Musculoskeletal: Negative.   Skin: Negative.   Neurological: Negative.   Psychiatric/Behavioral: Negative.      Physical Exam BP 124/74   Ht 5\' 5"  (1.651 m)   Wt 130 lb (59 kg)   LMP 05/28/2018   BMI 21.63 kg/m   Patient's last menstrual period was 05/28/2018. Physical Exam Constitutional:      General: She is not in acute distress.    Appearance: Normal appearance. She is well-developed.  Genitourinary:     Pelvic exam was performed with patient supine.     Vulva, inguinal canal, urethra, bladder, vagina, uterus and right adnexa normal.     No posterior fourchette tenderness, injury or lesion present.     No cervical friability, lesion, bleeding or polyp.     No left adnexal mass present.     Left adnexa tender (mild ttp on deep palpation).     Left adnexa not full.  HENT:     Head: Normocephalic and atraumatic.  Eyes:     General: No scleral icterus.    Conjunctiva/sclera: Conjunctivae normal.  Neck:     Musculoskeletal: Normal range of motion and neck supple.  Cardiovascular:     Rate and Rhythm: Normal rate and regular rhythm.     Heart sounds: No murmur. No friction rub. No gallop.   Pulmonary:     Effort: Pulmonary effort is normal. No respiratory distress.     Breath sounds: Normal breath sounds. No wheezing or rales.  Abdominal:     General: Bowel sounds are normal. There is no distension.     Palpations: Abdomen is soft. There is no mass.     Tenderness: There is abdominal tenderness (LLQ on deep palpation). There is no guarding or rebound.  Musculoskeletal: Normal range of motion.  Neurological:     General: No focal deficit present.     Mental Status: She is alert and oriented to person, place, and time.     Cranial Nerves: No cranial nerve deficit.  Skin:    General: Skin is warm and dry.     Findings: No erythema.  Psychiatric:        Mood and Affect: Mood normal.        Behavior: Behavior normal.        Judgment: Judgment normal.     Female chaperone present for pelvic and breast  portions of the physical exam  Assessment: 28 y.o. No obstetric history on file. female here for  1. Left ovarian cyst   2. Left lower quadrant abdominal pain   3. 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  She has multiple GI issues, which are a more likely explanation for her pain.  I recommended that she follow-up with a gastroenterologist or her primary care provider to further investigate.  If a gynecologic source is still suspected, discussion for a pelvic ultrasound to further characterize pelvic anatomy is recommended.  Additionally, further testing for Pap smear and STDs.  Consideration for discussion of endometriosis given the cyclicity of her pain in her pelvic region and subsequent potential work-up of that possible diagnosis would be recommended.  Prilosec prescribed (she did not get the printed prescription from the ER, which I verified today).  We will have her follow-up for a routine gynecologic exam and reassessment of symptoms in 3 months.  At that time may consider a Pap smear.  30 minutes spent in face to face discussion with > 50% spent in counseling,management, and coordination of care of her left lower quadrant abdominal pain and left ovarian cyst.   Thomasene Mohair, MD 07/03/2018 10:51 AM

## 2018-10-07 ENCOUNTER — Ambulatory Visit: Payer: Self-pay | Admitting: Obstetrics and Gynecology

## 2018-10-07 ENCOUNTER — Telehealth: Payer: Self-pay

## 2018-10-07 NOTE — Telephone Encounter (Signed)
Pt called triage stating she originally had an appt today for her annual and it was canceled. She would like to be seen because she is having pain in her ovaries at all times not just during menstruation or ovulation. She was calling to see if that is something she can be seen for. Please advise, Thank you!

## 2018-10-07 NOTE — Telephone Encounter (Signed)
Yes she can, please schedule an appt for her

## 2018-10-08 NOTE — Telephone Encounter (Signed)
Can you call her for me?

## 2018-10-09 NOTE — Telephone Encounter (Signed)
Patient was schedule 10/15/18 with [facility]. Patient called and cancelled due to no insurance at this time

## 2018-10-15 ENCOUNTER — Ambulatory Visit: Payer: Self-pay | Admitting: Obstetrics and Gynecology

## 2019-02-24 ENCOUNTER — Emergency Department: Payer: Self-pay

## 2019-02-24 ENCOUNTER — Emergency Department
Admission: EM | Admit: 2019-02-24 | Discharge: 2019-02-24 | Disposition: A | Discharge status: Home / Self Care | Payer: Self-pay | Attending: Emergency Medicine | Admitting: Emergency Medicine

## 2019-02-24 ENCOUNTER — Other Ambulatory Visit: Payer: Self-pay

## 2019-02-24 ENCOUNTER — Encounter: Payer: Self-pay | Admitting: Emergency Medicine

## 2019-02-24 DIAGNOSIS — R102 Pelvic and perineal pain: Secondary | ICD-10-CM | POA: Insufficient documentation

## 2019-02-24 DIAGNOSIS — Z87891 Personal history of nicotine dependence: Secondary | ICD-10-CM | POA: Insufficient documentation

## 2019-02-24 LAB — CBC WITH DIFFERENTIAL/PLATELET
Abs Immature Granulocytes: 0.05 10*3/uL (ref 0.00–0.07)
Basophils Absolute: 0.1 10*3/uL (ref 0.0–0.1)
Basophils Relative: 0 %
Eosinophils Absolute: 0.1 10*3/uL (ref 0.0–0.5)
Eosinophils Relative: 1 %
HCT: 37.5 % (ref 36.0–46.0)
Hemoglobin: 13 g/dL (ref 12.0–15.0)
Immature Granulocytes: 0 %
Lymphocytes Relative: 17 %
Lymphs Abs: 2 10*3/uL (ref 0.7–4.0)
MCH: 32.8 pg (ref 26.0–34.0)
MCHC: 34.7 g/dL (ref 30.0–36.0)
MCV: 94.7 fL (ref 80.0–100.0)
Monocytes Absolute: 1 10*3/uL (ref 0.1–1.0)
Monocytes Relative: 8 %
Neutro Abs: 8.3 10*3/uL — ABNORMAL HIGH (ref 1.7–7.7)
Neutrophils Relative %: 74 %
Platelets: 318 10*3/uL (ref 150–400)
RBC: 3.96 MIL/uL (ref 3.87–5.11)
RDW: 11.6 % (ref 11.5–15.5)
WBC: 11.4 10*3/uL — ABNORMAL HIGH (ref 4.0–10.5)
nRBC: 0 % (ref 0.0–0.2)

## 2019-02-24 LAB — COMPREHENSIVE METABOLIC PANEL
ALT: 9 U/L (ref 0–44)
AST: 17 U/L (ref 15–41)
Albumin: 4.3 g/dL (ref 3.5–5.0)
Alkaline Phosphatase: 72 U/L (ref 38–126)
Anion gap: 8 (ref 5–15)
BUN: 11 mg/dL (ref 6–20)
CO2: 26 mmol/L (ref 22–32)
Calcium: 8.8 mg/dL — ABNORMAL LOW (ref 8.9–10.3)
Chloride: 106 mmol/L (ref 98–111)
Creatinine, Ser: 0.63 mg/dL (ref 0.44–1.00)
GFR calc Af Amer: >60 mL/min (ref >60)
GFR calc non Af Amer: >60 mL/min (ref >60)
Glucose, Bld: 97 mg/dL (ref 70–99)
Potassium: 3.9 mmol/L (ref 3.5–5.1)
Sodium: 140 mmol/L (ref 135–145)
Total Bilirubin: 1.2 mg/dL (ref 0.3–1.2)
Total Protein: 6.9 g/dL (ref 6.5–8.1)

## 2019-02-24 LAB — URINALYSIS, COMPLETE (UACMP) WITH MICROSCOPIC
Bacteria, UA: NONE SEEN
Bilirubin Urine: NEGATIVE
Glucose, UA: NEGATIVE mg/dL
Ketones, ur: NEGATIVE mg/dL
Leukocytes,Ua: NEGATIVE
Nitrite: NEGATIVE
Protein, ur: 30 mg/dL — AB
RBC / HPF: >50 RBC/hpf — ABNORMAL HIGH (ref 0–5)
Specific Gravity, Urine: 1.029 (ref 1.005–1.030)
pH: 6 (ref 5.0–8.0)

## 2019-02-24 LAB — WET PREP, GENITAL
Sperm: NONE SEEN
Trich, Wet Prep: NONE SEEN
Yeast Wet Prep HPF POC: NONE SEEN

## 2019-02-24 LAB — LIPASE, BLOOD: Lipase: 33 U/L (ref 11–51)

## 2019-02-24 LAB — POCT PREGNANCY, URINE: Preg Test, Ur: NEGATIVE

## 2019-02-24 MED ORDER — OXYCODONE-ACETAMINOPHEN 5-325 MG PO TABS: 1.0000 | ORAL_TABLET | Freq: Three times a day (TID) | ORAL | 0 refills | Status: DC | PRN

## 2019-02-24 MED ORDER — ONDANSETRON HCL 4 MG/2ML IJ SOLN
4.0000 mg | Freq: Once | INTRAMUSCULAR | Status: AC
  Administered 2019-02-24: 4 mg via INTRAVENOUS
  Filled 2019-02-24: qty 2

## 2019-02-24 MED ORDER — OXYCODONE-ACETAMINOPHEN 5-325 MG PO TABS
2.0000 | ORAL_TABLET | Freq: Once | ORAL | Status: AC
  Administered 2019-02-24: 2 via ORAL
  Filled 2019-02-24: qty 2

## 2019-02-24 MED ORDER — FENTANYL CITRATE (PF) 100 MCG/2ML IJ SOLN
50.0000 ug | Freq: Once | INTRAMUSCULAR | Status: AC
  Administered 2019-02-24: 50 ug via INTRAVENOUS
  Filled 2019-02-24: qty 2

## 2019-02-24 MED ORDER — IOHEXOL 300 MG/ML  SOLN
100.0000 mL | Freq: Once | INTRAMUSCULAR | Status: AC | PRN
  Administered 2019-02-24: 100 mL via INTRAVENOUS

## 2019-02-24 MED ORDER — MORPHINE SULFATE (PF) 4 MG/ML IV SOLN
4.0000 mg | Freq: Once | INTRAVENOUS | Status: AC
  Administered 2019-02-24: 09:00:00 4 mg via INTRAVENOUS
  Filled 2019-02-24: qty 1

## 2019-02-24 MED ORDER — METRONIDAZOLE 500 MG PO TABS: 500.0000 mg | ORAL_TABLET | Freq: Two times a day (BID) | ORAL | 0 refills | Status: DC

## 2019-02-24 NOTE — ED Provider Notes (Signed)
[facility] Emergency Department Provider Note       Time seen: ----------------------------------------- 8:17 AM on 02/24/2019 -----------------------------------------   I have reviewed the triage vital signs and the nursing notes.  HISTORY   Chief Complaint Abdominal Cramping    HPI Sara Berger is a 28 y.o. female with a history of depression, IBS who presents to the ED for lower quadrant pain that began this morning.  Patient reports she started her menstrual cycle this morning as well.  She has a history of endometriosis and ovarian cyst.  Pain is 9 out of 10 in the lower abdomen.  Past Medical History:  Diagnosis Date  . Depression   . IBS (irritable bowel syndrome)     Patient Active Problem List   Diagnosis Date Noted  . Peptic ulcer 07/03/2018    Past Surgical History:  Procedure Laterality Date  . CHOLECYSTECTOMY    . COLONOSCOPY      Allergies Penicillins  Social History Social History   Tobacco Use  . Smoking status: Former Games developermoker  . Smokeless tobacco: Never Used  Substance Use Topics  . Alcohol use: Yes  . Drug use: No    Review of Systems Constitutional: Negative for fever. Cardiovascular: Negative for chest pain. Respiratory: Negative for shortness of breath. Gastrointestinal: Positive for abdominal pain Musculoskeletal: Negative for back pain. Skin: Negative for rash. Neurological: Negative for headaches, focal weakness or numbness.  All systems negative/normal/unremarkable except as stated in the HPI  ____________________________________________   PHYSICAL EXAM:  VITAL SIGNS: ED Triage Vitals [02/24/19 0759]  Enc Vitals Group     BP 113/86     Pulse Rate 97     Resp 16     Temp 98.3 F (36.8 C)     Temp Source Oral     SpO2 100 %     Weight 130 lb (59 kg)     Height 5\' 5"  (1.651 m)     Head Circumference      Peak Flow      Pain Score 9     Pain Loc      Pain Edu?      Excl. in GC?     Constitutional: Alert and oriented. Well appearing and in no distress. Eyes: Conjunctivae are normal. Normal extraocular movements. Cardiovascular: Normal rate, regular rhythm. No murmurs, rubs, or gallops. Respiratory: Normal respiratory effort without tachypnea nor retractions. Breath sounds are clear and equal bilaterally. No wheezes/rales/rhonchi. Gastrointestinal: Exquisite left lower quadrant tenderness, no rebound or guarding.  Normal bowel sounds. Genitourinary: Vaginal bleeding is noted, cervical motion tenderness is noted Musculoskeletal: Nontender with normal range of motion in extremities. No lower extremity tenderness nor edema. Neurologic:  Normal speech and language. No gross focal neurologic deficits are appreciated.  Skin:  Skin is warm, dry and intact. No rash noted. Psychiatric: Mood and affect are normal. Speech and behavior are normal.  ____________________________________________  ED COURSE:  As part of my medical decision making, I reviewed the following data within the electronic MEDICAL RECORD NUMBER History obtained from family if available, nursing notes, old chart and ekg, as well as notes from prior ED visits. Patient presented for abdominal pain, we will assess with labs and imaging as indicated at this time.   Procedures  Sara PardonCassandra Juniel was evaluated in Emergency Department on 02/24/2019 for the symptoms described in the history of present illness. She was evaluated in the context of the global COVID-19 pandemic, which necessitated consideration that the patient might be  at risk for infection with the SARS-CoV-2 virus that causes COVID-19. Institutional protocols and algorithms that pertain to the evaluation of patients at risk for COVID-19 are in a state of rapid change based on information released by regulatory bodies including the CDC and federal and state organizations. These policies and algorithms were followed during the patient's care in the ED.   ____________________________________________   LABS (pertinent positives/negatives)  Labs Reviewed  WET PREP, GENITAL - Abnormal; Notable for the following components:      Result Value   Clue Cells Wet Prep HPF POC PRESENT (*)    WBC, Wet Prep HPF POC FEW (*)    All other components within normal limits  CBC WITH DIFFERENTIAL/PLATELET - Abnormal; Notable for the following components:   WBC 11.4 (*)    Neutro Abs 8.3 (*)    All other components within normal limits  COMPREHENSIVE METABOLIC PANEL - Abnormal; Notable for the following components:   Calcium 8.8 (*)    All other components within normal limits  URINALYSIS, COMPLETE (UACMP) WITH MICROSCOPIC - Abnormal; Notable for the following components:   Color, Urine YELLOW (*)    APPearance HAZY (*)    Hgb urine dipstick MODERATE (*)    Protein, ur 30 (*)    RBC / HPF >50 (*)    All other components within normal limits  GC/CHLAMYDIA PROBE AMP  LIPASE, BLOOD  POC URINE PREG, ED  POCT PREGNANCY, URINE    RADIOLOGY Images were viewed by me  IMPRESSION: 1. No acute process in the abdomen or pelvis. 2. Unchanged mass in the right kidney since 06/30/2018. MR (preferred) or CT according to a renal protocol could be performed for further evaluation. IMPRESSION: 1. Normal pelvic ultrasound. ____________________________________________   DIFFERENTIAL DIAGNOSIS   Ovarian cyst, dysmenorrhea, endometriosis, torsion, renal colic, UTI, pyelonephritis  FINAL ASSESSMENT AND PLAN  abdominal pain, history of endometriosis, dysmenorrhea   Plan: The patient had presented for abdominal pain. Patient's labs were reassuring. Patient's imaging did not reveal any acute process.  No specific etiology was discovered for her pain.  Likely combination of dysmenorrhea and endometriosis.  She reports she has not bled in the last 4 months.  She will be discharged with pain medicine and will be referred to GYN for outpatient  follow-up.   Laurence Aly, MD    Note: This note was generated in part or whole with voice recognition software. Voice recognition is usually quite accurate but there are transcription errors that can and very often do occur. I apologize for any typographical errors that were not detected and corrected.     Earleen Newport, MD 02/24/19 [phone]

## 2019-02-24 NOTE — ED Notes (Signed)
Patient back from US at this time.

## 2019-02-24 NOTE — ED Triage Notes (Signed)
Pt here with c/o LLQ pain that began this am. Started period this am as well. Hx of endometriosis and ovarian cyst. Denies N/V/D. NAD.

## 2019-03-04 ENCOUNTER — Telehealth: Payer: Self-pay | Admitting: Emergency Medicine

## 2019-03-04 LAB — GC/CHLAMYDIA PROBE AMP

## 2019-03-04 NOTE — Telephone Encounter (Signed)
Lab called ED charge nurse and said the gonorrhea and chlamydia tests from 02/24/2019 was not done due to backorder of lab supplies.  I called the patient to inform her that the test was not done.  I left a message with my number.

## 2019-03-18 ENCOUNTER — Encounter: Payer: Self-pay | Admitting: Intensive Care

## 2019-03-18 ENCOUNTER — Emergency Department
Admission: EM | Admit: 2019-03-18 | Discharge: 2019-03-18 | Disposition: A | Discharge status: Home / Self Care | Payer: Self-pay | Attending: Emergency Medicine | Admitting: Emergency Medicine

## 2019-03-18 ENCOUNTER — Other Ambulatory Visit: Payer: Self-pay

## 2019-03-18 DIAGNOSIS — Z87891 Personal history of nicotine dependence: Secondary | ICD-10-CM | POA: Insufficient documentation

## 2019-03-18 DIAGNOSIS — J020 Streptococcal pharyngitis: Secondary | ICD-10-CM | POA: Insufficient documentation

## 2019-03-18 LAB — GROUP A STREP BY PCR: Group A Strep by PCR: DETECTED — AB

## 2019-03-18 MED ORDER — DIPHENHYDRAMINE HCL 12.5 MG/5ML PO ELIX
12.5000 mg | ORAL_SOLUTION | Freq: Once | ORAL | Status: AC
  Administered 2019-03-18: 12.5 mg via ORAL
  Filled 2019-03-18: qty 5

## 2019-03-18 MED ORDER — LIDOCAINE VISCOUS HCL 2 % MT SOLN
15.0000 mL | Freq: Once | OROMUCOSAL | Status: AC
  Administered 2019-03-18: 15 mL via OROMUCOSAL
  Filled 2019-03-18: qty 15

## 2019-03-18 MED ORDER — LIDOCAINE VISCOUS HCL 2 % MT SOLN: 5.0000 mL | Freq: Four times a day (QID) | OROMUCOSAL | 0 refills | Status: AC | PRN

## 2019-03-18 MED ORDER — AZITHROMYCIN 250 MG PO TABS: ORAL_TABLET | ORAL | 0 refills | Status: DC

## 2019-03-18 NOTE — ED Provider Notes (Signed)
[facility] Emergency Department Provider Note   ____________________________________________   First MD Initiated Contact with Patient 03/18/19 1343     (approximate)  I have reviewed the triage vital signs and the nursing notes.   HISTORY  Chief Complaint Sore Throat    HPI Sara Berger is a 28 y.o. female patient complain of sore throat for 3 days.  Patient has a history of repeated strep pharyngitis.  Patient state painful swallowing with food and fluids.  Patient denies URI signs and symptoms.  Patient denies fever associated with complaint.  Patient rates the pain as a 9/10.  Patient described the pain is "achy".  No palliative measure for complaint.         Past Medical History:  Diagnosis Date  . Depression   . IBS (irritable bowel syndrome)     Patient Active Problem List   Diagnosis Date Noted  . Peptic ulcer 07/03/2018    Past Surgical History:  Procedure Laterality Date  . CHOLECYSTECTOMY    . COLONOSCOPY      Prior to Admission medications   Medication Sig Start Date End Date Taking? Authorizing Provider  azithromycin (ZITHROMAX Z-PAK) 250 MG tablet Take 2 tablets (500 mg) on  Day 1,  followed by 1 tablet (250 mg) once daily on Days 2 through 5. 03/18/19 03/23/19  Joni Reining, PA-C  lamoTRIgine (LAMICTAL) 25 MG tablet Take 25 mg by mouth 2 (two) times daily.  05/27/18   [provider]  lidocaine (XYLOCAINE) 2 % solution Use as directed 5 mLs in the mouth or throat every 6 (six) hours as needed for mouth pain. 03/18/19   Joni Reining, PA-C    Allergies Penicillins  Family History  Problem Relation Age of Onset  . Breast cancer Maternal Grandmother   . Colon cancer Maternal Grandfather   . Diabetes Mother   . Congestive Heart Failure Mother   . Heart attack Mother        5 heart attacks within 13 months  . Diabetes Maternal Uncle   . Heart attack Maternal Uncle 45    Social History Social History    Tobacco Use  . Smoking status: Former Games developer  . Smokeless tobacco: Never Used  Substance Use Topics  . Alcohol use: Yes  . Drug use: Yes    Comment: prescribed percocet    Review of Systems Constitutional: No fever/chills Eyes: No visual changes. ENT: No sore throat. Cardiovascular: Denies chest pain. Respiratory: Denies shortness of breath. Gastrointestinal: No abdominal pain.  No nausea, no vomiting.  No diarrhea.  No constipation. Genitourinary: Negative for dysuria. Musculoskeletal: Negative for back pain. Skin: Negative for rash. Neurological: Negative for headaches, focal weakness or numbness. Psychiatric:  Depression Allergic/Immunilogical: Penicillin ____________________________________________   PHYSICAL EXAM:  VITAL SIGNS: ED Triage Vitals [03/18/19 1302]  Enc Vitals Group     BP 137/88     Pulse Rate 94     Resp 16     Temp 98.8 F (37.1 C)     Temp Source Oral     SpO2 99 %     Weight 135 lb (61.2 kg)     Height 5\' 5"  (1.651 m)     Head Circumference      Peak Flow      Pain Score 9     Pain Loc      Pain Edu?      Excl. in GC?     Constitutional: Alert and oriented.  Moderate distress. Mouth/Throat: Mucous membranes are moist.  Oropharynx non-erythematous.  Edematous and exudative tonsils. Neck: No stridor.   Hematological/Lymphatic/Immunilogical: Bilateral cervical lymphadenopathy. Cardiovascular: Normal rate, regular rhythm. Grossly normal heart sounds.  Good peripheral circulation. Respiratory: Normal respiratory effort.  No retractions. Lungs CTAB.  ____________________________________________   LABS (all labs ordered are listed, but only abnormal results are displayed)  Labs Reviewed  GROUP A STREP BY PCR - Abnormal; Notable for the following components:      Result Value   Group A Strep by PCR DETECTED (*)    All other components within normal limits   ____________________________________________  EKG    ____________________________________________  RADIOLOGY  ED MD interpretation:    Official radiology report(s): No results found.  ____________________________________________   PROCEDURES  Procedure(s) performed (including Critical Care):  Procedures   ____________________________________________   INITIAL IMPRESSION / ASSESSMENT AND PLAN / ED COURSE  As part of my medical decision making, I reviewed the following data within the Devine was evaluated in Emergency Department on 03/18/2019 for the symptoms described in the history of present illness. She was evaluated in the context of the global COVID-19 pandemic, which necessitated consideration that the patient might be at risk for infection with the SARS-CoV-2 virus that causes COVID-19. Institutional protocols and algorithms that pertain to the evaluation of patients at risk for COVID-19 are in a state of rapid change based on information released by regulatory bodies including the CDC and federal and state organizations. These policies and algorithms were followed during the patient's care in the ED.  Patient presents with acute tonsillitis with positive rapid strep test.  Patient given discharge care instruction advised take medication as directed.  Patient advised [facility] with [facility].      ____________________________________________   FINAL CLINICAL IMPRESSION(S) / ED DIAGNOSES  Final diagnoses:  Strep pharyngitis     ED Discharge Orders         Ordered    azithromycin (ZITHROMAX Z-PAK) 250 MG tablet     03/18/19 1447    lidocaine (XYLOCAINE) 2 % solution  Every 6 hours PRN     03/18/19 1447           Note:  This document was prepared using Dragon voice recognition software and may include unintentional dictation errors.    Sable Feil, PA-C 03/18/19 1452    Sara Divine, MD 03/18/19 [phone]

## 2019-03-18 NOTE — ED Triage Notes (Signed)
Patient c/o sore throat since Saturday. HX strep. Crying during triage

## 2019-03-18 NOTE — ED Notes (Signed)
See triage note  Presents with sore throat since Saturday  States pain became worse  Swelling and redness noted to left side of neck    Pt is very tearful  States she is having a hard time swallowing

## 2019-03-19 ENCOUNTER — Telehealth: Payer: Self-pay

## 2019-03-19 NOTE — Telephone Encounter (Signed)
Called pt on 03/19/2019 at 3:04pm and LVM about becoming a new pt.

## 2019-03-20 ENCOUNTER — Inpatient Hospital Stay
Admission: EM | Admit: 2019-03-20 | Discharge: 2019-03-22 | DRG: 153 | Disposition: A | Discharge status: Home / Self Care | Payer: Self-pay | Attending: Internal Medicine | Admitting: Internal Medicine

## 2019-03-20 ENCOUNTER — Other Ambulatory Visit: Payer: Self-pay

## 2019-03-20 ENCOUNTER — Encounter: Payer: Self-pay | Admitting: Intensive Care

## 2019-03-20 ENCOUNTER — Emergency Department: Payer: Self-pay

## 2019-03-20 DIAGNOSIS — Z880 Allergy status to penicillin: Secondary | ICD-10-CM

## 2019-03-20 DIAGNOSIS — J039 Acute tonsillitis, unspecified: Principal | ICD-10-CM | POA: Diagnosis present

## 2019-03-20 DIAGNOSIS — Z87891 Personal history of nicotine dependence: Secondary | ICD-10-CM

## 2019-03-20 DIAGNOSIS — F419 Anxiety disorder, unspecified: Secondary | ICD-10-CM | POA: Diagnosis present

## 2019-03-20 DIAGNOSIS — Z79899 Other long term (current) drug therapy: Secondary | ICD-10-CM

## 2019-03-20 DIAGNOSIS — K122 Cellulitis and abscess of mouth: Secondary | ICD-10-CM | POA: Diagnosis present

## 2019-03-20 DIAGNOSIS — F329 Major depressive disorder, single episode, unspecified: Secondary | ICD-10-CM | POA: Diagnosis present

## 2019-03-20 DIAGNOSIS — Z20828 Contact with and (suspected) exposure to other viral communicable diseases: Secondary | ICD-10-CM | POA: Diagnosis present

## 2019-03-20 HISTORY — DX: Streptococcal pharyngitis: J02.0

## 2019-03-20 LAB — COMPREHENSIVE METABOLIC PANEL
ALT: 93 U/L — ABNORMAL HIGH (ref 0–44)
AST: 33 U/L (ref 15–41)
Albumin: 3.7 g/dL (ref 3.5–5.0)
Alkaline Phosphatase: 112 U/L (ref 38–126)
Anion gap: 11 (ref 5–15)
BUN: 11 mg/dL (ref 6–20)
CO2: 23 mmol/L (ref 22–32)
Calcium: 9.2 mg/dL (ref 8.9–10.3)
Chloride: 105 mmol/L (ref 98–111)
Creatinine, Ser: 0.52 mg/dL (ref 0.44–1.00)
GFR calc Af Amer: >60 mL/min (ref >60)
GFR calc non Af Amer: >60 mL/min (ref >60)
Glucose, Bld: 87 mg/dL (ref 70–99)
Potassium: 3.6 mmol/L (ref 3.5–5.1)
Sodium: 139 mmol/L (ref 135–145)
Total Bilirubin: 1.5 mg/dL — ABNORMAL HIGH (ref 0.3–1.2)
Total Protein: 7.3 g/dL (ref 6.5–8.1)

## 2019-03-20 LAB — CBC WITH DIFFERENTIAL/PLATELET
Abs Immature Granulocytes: 0.11 10*3/uL — ABNORMAL HIGH (ref 0.00–0.07)
Basophils Absolute: 0 10*3/uL (ref 0.0–0.1)
Basophils Relative: 0 %
Eosinophils Absolute: 0.1 10*3/uL (ref 0.0–0.5)
Eosinophils Relative: 0 %
HCT: 37.7 % (ref 36.0–46.0)
Hemoglobin: 13 g/dL (ref 12.0–15.0)
Immature Granulocytes: 1 %
Lymphocytes Relative: 10 %
Lymphs Abs: 1.4 10*3/uL (ref 0.7–4.0)
MCH: 32.4 pg (ref 26.0–34.0)
MCHC: 34.5 g/dL (ref 30.0–36.0)
MCV: 94 fL (ref 80.0–100.0)
Monocytes Absolute: 1.2 10*3/uL — ABNORMAL HIGH (ref 0.1–1.0)
Monocytes Relative: 8 %
Neutro Abs: 11.7 10*3/uL — ABNORMAL HIGH (ref 1.7–7.7)
Neutrophils Relative %: 81 %
Platelets: 291 10*3/uL (ref 150–400)
RBC: 4.01 MIL/uL (ref 3.87–5.11)
RDW: 11.8 % (ref 11.5–15.5)
WBC: 14.5 10*3/uL — ABNORMAL HIGH (ref 4.0–10.5)
nRBC: 0 % (ref 0.0–0.2)

## 2019-03-20 LAB — SARS CORONAVIRUS 2 BY RT PCR (HOSPITAL ORDER, PERFORMED IN ~~LOC~~ HOSPITAL LAB): SARS Coronavirus 2: NEGATIVE

## 2019-03-20 MED ORDER — SODIUM CHLORIDE 0.9 % IV SOLN
INTRAVENOUS | Status: DC
  Administered 2019-03-21 (×2): via INTRAVENOUS

## 2019-03-20 MED ORDER — HYDROMORPHONE HCL 1 MG/ML IJ SOLN
0.5000 mg | Freq: Once | INTRAMUSCULAR | Status: AC
  Administered 2019-03-20: 0.5 mg via INTRAVENOUS
  Filled 2019-03-20: qty 1

## 2019-03-20 MED ORDER — DEXAMETHASONE SODIUM PHOSPHATE 10 MG/ML IJ SOLN
10.0000 mg | Freq: Three times a day (TID) | INTRAMUSCULAR | Status: DC
  Administered 2019-03-21 – 2019-03-22 (×5): 10 mg via INTRAVENOUS
  Filled 2019-03-20 (×7): qty 1

## 2019-03-20 MED ORDER — LIDOCAINE HCL URETHRAL/MUCOSAL 2 % EX GEL
1.0000 "application " | Freq: Once | CUTANEOUS | Status: AC
  Administered 2019-03-20: 1

## 2019-03-20 MED ORDER — HYDROMORPHONE HCL 1 MG/ML IJ SOLN: 1.0000 mg | INTRAMUSCULAR | Status: DC | PRN

## 2019-03-20 MED ORDER — SODIUM CHLORIDE 0.9 % IV BOLUS
1000.0000 mL | Freq: Once | INTRAVENOUS | Status: AC
  Administered 2019-03-20: 1000 mL via INTRAVENOUS

## 2019-03-20 MED ORDER — CLINDAMYCIN PHOSPHATE 600 MG/50ML IV SOLN
600.0000 mg | Freq: Once | INTRAVENOUS | Status: AC
  Administered 2019-03-20: 600 mg via INTRAVENOUS
  Filled 2019-03-20: qty 50

## 2019-03-20 MED ORDER — DEXAMETHASONE SODIUM PHOSPHATE 10 MG/ML IJ SOLN
10.0000 mg | Freq: Once | INTRAMUSCULAR | Status: AC
  Administered 2019-03-20: 10 mg via INTRAVENOUS
  Filled 2019-03-20: qty 1

## 2019-03-20 MED ORDER — OXYMETAZOLINE HCL 0.05 % NA SOLN
1.0000 | Freq: Once | NASAL | Status: AC
  Administered 2019-03-20: 1 via NASAL

## 2019-03-20 MED ORDER — IOHEXOL 300 MG/ML  SOLN
75.0000 mL | Freq: Once | INTRAMUSCULAR | Status: AC | PRN
  Administered 2019-03-20: 75 mL via INTRAVENOUS
  Filled 2019-03-20: qty 75

## 2019-03-20 MED ORDER — CLINDAMYCIN PHOSPHATE 300 MG/50ML IV SOLN
300.0000 mg | Freq: Once | INTRAVENOUS | Status: AC
  Administered 2019-03-20: 300 mg via INTRAVENOUS
  Filled 2019-03-20: qty 50

## 2019-03-20 MED ORDER — KETOROLAC TROMETHAMINE 30 MG/ML IJ SOLN
15.0000 mg | Freq: Four times a day (QID) | INTRAMUSCULAR | Status: DC | PRN
  Administered 2019-03-21: 15 mg via INTRAVENOUS
  Filled 2019-03-20: qty 1

## 2019-03-20 NOTE — H&P (Signed)
[facility] [place] NAME: Sara Berger    MR#:  [id number]  DATE OF BIRTH:  09-10-1990  DATE OF ADMISSION:  03/20/2019  PRIMARY CARE PHYSICIAN: Patient, No Pcp Per   REQUESTING/REFERRING PHYSICIAN: Lenise Arena, MD  CHIEF COMPLAINT:   Chief Complaint  Patient presents with  . Sore Throat    HISTORY OF PRESENT ILLNESS:  28 y.o. female with pertinent past medical history of depression and anxiety, IBS and recent strep throat with complaints of worsening pharyngitis associated with left neck swelling and pain.  Patient reported having odynophagia and dysphagia for 1 week.  She presented to the ED on 03/18/2019 and was started on amoxicillin for 5 days without signs of improvement.  Her symptoms worsened, as did limitation of her tongue movement.  She denied trauma of orobuccolingual regions. Following limited response to the initial medical treatment, the patient decided to come to the ED for further evaluation.  On arrival to the ED, she was afebrile with blood pressure 148/97 mm Hg and pulse rate 101 beats/min. There were no focal neurological deficits; she was alert and oriented x4 but appeared uncomfortable. Labs revealed WBC 14.5 otherwise unremarkable CBC, ALT 93, bilirubin 1.5 otherwise unremarkable CMP.  On exam floor of mouth and BOT were swollen without airway obstruction.  She had markedly dysphagia, odynophagia, severe trismus and submaxillary adenopathies. The patient showed marked facial asymmetry, with a painful indurated tumefaction in the left submandibular region, without clear boundaries.  Contrast enhanced CT scan demonstrated floor of mouth infection with an abscess at the left sublingual space.  ENT was contacted and patient had a transnasal flexible laryngoscope the bedside which demonstrated mildly enlarged and edematous left base of tongue with punctate area of purulence laterally. Given that the area was ordered  drainage no surgical intervention or drainage was recommended.  Patient will be admitted for IV antibiotic, Decadron and pain management  PAST MEDICAL HISTORY:   Past Medical History:  Diagnosis Date  . Depression   . IBS (irritable bowel syndrome)   . Strep throat     PAST SURGICAL HISTORY:   Past Surgical History:  Procedure Laterality Date  . CHOLECYSTECTOMY    . COLONOSCOPY      SOCIAL HISTORY:   Social History   Tobacco Use  . Smoking status: Former Research scientist (life sciences)  . Smokeless tobacco: Never Used  Substance Use Topics  . Alcohol use: Yes    Alcohol/week: 6.0 standard drinks    Types: 6 Cans of beer per week    FAMILY HISTORY:   Family History  Problem Relation Age of Onset  . Breast cancer Maternal Grandmother   . Colon cancer Maternal Grandfather   . Diabetes Mother   . Congestive Heart Failure Mother   . Heart attack Mother        5 heart attacks within 13 months  . Diabetes Maternal Uncle   . Heart attack Maternal Uncle 29    DRUG ALLERGIES:   Allergies  Allergen Reactions  . Penicillins     REVIEW OF SYSTEMS:   Review of Systems  Constitutional: Negative for chills, fever, malaise/fatigue and weight loss.  HENT: Positive for sore throat. Negative for congestion and hearing loss.        Dysphagia, odonyphagia  Eyes: Negative for blurred vision and double vision.  Respiratory: Negative for cough, shortness of breath and wheezing.   Cardiovascular: Negative for chest pain, palpitations, orthopnea and leg swelling.  Gastrointestinal: Negative  for abdominal pain, diarrhea, nausea and vomiting.  Genitourinary: Negative for dysuria and urgency.  Musculoskeletal: Negative for myalgias.  Skin: Negative for rash.  Neurological: Negative for dizziness, sensory change, speech change, focal weakness and headaches.  Psychiatric/Behavioral: Positive for depression. The patient is nervous/anxious.    MEDICATIONS AT HOME:   Prior to Admission medications    Medication Sig Start Date End Date Taking? Authorizing Provider  azithromycin (ZITHROMAX Z-PAK) 250 MG tablet Take 2 tablets (500 mg) on  Day 1,  followed by 1 tablet (250 mg) once daily on Days 2 through 5. 03/18/19 03/23/19 Yes Joni ReiningSmith, Ronald K, PA-C  lidocaine (XYLOCAINE) 2 % solution Use as directed 5 mLs in the mouth or throat every 6 (six) hours as needed for mouth pain. 03/18/19  Yes Joni ReiningSmith, Ronald K, PA-C  lamoTRIgine (LAMICTAL) 25 MG tablet Take 25 mg by mouth 2 (two) times daily.  05/27/18   [provider]      VITAL SIGNS:  Blood pressure 140/89, pulse 82, temperature 99.6 F (37.6 C), temperature source Oral, resp. rate 14, height 5\' 5"  (1.651 m), weight 63.5 kg, last menstrual period 02/24/2019, SpO2 96 %.  PHYSICAL EXAMINATION:   Physical Exam  GENERAL:  28 y.o.-year-old patient lying in the bed with no acute distress.  EYES: Pupils equal, round, reactive to light and accommodation. No scleral icterus. Extraocular muscles intact.  HEENT: Head atraumatic, normocephalic. severe trismus and submaxillary adenopathies. Facial asymmetry, with a painful indurated tumefaction in the left submandibular region, without clear boundaries.  NECK:  Supple, no jugular venous distention. No thyroid enlargement, no tenderness.  LUNGS: Normal breath sounds bilaterally, no wheezing, rales,rhonchi or crepitation. No use of accessory muscles of respiration.  CARDIOVASCULAR: S1, S2 normal. No murmurs, rubs, or gallops.  ABDOMEN: Soft, nontender, nondistended. Bowel sounds present. No organomegaly or mass.  EXTREMITIES: No pedal edema, cyanosis, or clubbing.  NEUROLOGIC: Cranial nerves II through XII are intact. Muscle strength 5/5 in all extremities. Sensation intact. Gait not checked.  PSYCHIATRIC: The patient is alert and oriented x 3.  SKIN: No obvious rash, lesion, or ulcer.   DATA REVIEWED:  LABORATORY PANEL:   CBC Recent Labs  Lab 03/20/19 1807  WBC 14.5*  HGB 13.0  HCT 37.7   PLT 291   ------------------------------------------------------------------------------------------------------------------  Chemistries  Recent Labs  Lab 03/20/19 1807  NA 139  K 3.6  CL 105  CO2 23  GLUCOSE 87  BUN 11  CREATININE 0.52  CALCIUM 9.2  AST 33  ALT 93*  ALKPHOS 112  BILITOT 1.5*   ------------------------------------------------------------------------------------------------------------------  Cardiac Enzymes No results for input(s): TROPONINI in the last 168 hours. ------------------------------------------------------------------------------------------------------------------  RADIOLOGY:  Ct Soft Tissue Neck W Contrast  Result Date: 03/20/2019 CLINICAL DATA:  Sore throat and stridor. EXAM: CT NECK WITH CONTRAST TECHNIQUE: Multidetector CT imaging of the neck was performed using the standard protocol following the bolus administration of intravenous contrast. CONTRAST:  75mL OMNIPAQUE IOHEXOL 300 MG/ML  SOLN COMPARISON:  None. FINDINGS: PHARYNX AND LARYNX: --Nasopharynx: Fossae of Rosenmuller are clear. Normal adenoid tonsils for age. --Oral cavity and oropharynx: There is a large amount of swelling in the sublingual space with a fluid collection measuring 2.9 x 1.5 x 0.8 cm likely arises from the lingual tonsils and extends into the left sublingual space. --Hypopharynx: There is crowding of the vallecular with effacement of the left parapharyngeal fat. --Larynx: The epiglottis is normal. Normal area of the glottic and vocal folds. --Retropharyngeal space: No abscess, effusion or lymphadenopathy.  SALIVARY GLANDS: --Parotid: No mass lesion or inflammation. No sialolithiasis or ductal dilatation. --Submandibular: Submandibular glands are enlarged. --Sublingual: Normal. No ranula or other visible lesion of the base of tongue and floor of mouth. THYROID: Normal. LYMPH NODES: There are multiple enlarged left level 2A cervical lymph nodes with numerous subcentimeter nodes  throughout the left cervical chain. VASCULAR: Major cervical vessels are patent. LIMITED INTRACRANIAL: Normal. VISUALIZED ORBITS: Normal. MASTOIDS AND VISUALIZED PARANASAL SINUSES: Right maxillary retention cyst. No mastoid or middle ear effusion. SKELETON: No bony spinal canal stenosis. No lytic or blastic lesions. UPPER CHEST: Multifocal parenchymal opacity in the right upper lobe, likely infectious. OTHER: None. IMPRESSION: 1. Floor of mouth infection with left sublingual space abscess measuring up to 2.9 x 1.5 x 0.8 cm, likely arising from the lingual tonsils, extending into the left sublingual space. 2. Multiple enlarged left level 2A cervical lymph nodes, likely reactive. 3. Multifocal parenchymal opacity in the right upper lobe, likely early pneumonia, possibly secondary to aspiration. Electronically Signed   By: Deatra Robinson M.D.   On: 03/20/2019 20:09    EKG:  EKG: there are no previous tracings available.  IMPRESSION AND PLAN:   28 y.o. female with pertinent past medical history of depression and anxiety, IBS and recent strep throat with complaints of worsening pharyngitis associated with left neck swelling and pain.  1. Acute lingual tonsillitis with sublingual abscess -recent history of strep throat - Admit to MedSurg unit - PRN IV and p.o. pain management - Start Decadron 10 mg Q8hrs - Start IV clindamycin 900 mg every 6 hours - Clear liquids for now - Patient seen by ENT Dr. Andee Poles no surgical intervention or drainage recommended at this time we will continue to follow with them.  2. Depression and anxiety - Continue Lamictal for mood stabilization  3. DVT prophylaxis-holding anticoagulation for possible incision and drainage if fails conservative treatment as above.  All the records are reviewed and case discussed with ED provider. Management plans discussed with the patient, family and they are in agreement.  CODE STATUS: FULL  TOTAL TIME TAKING CARE OF THIS PATIENT: 50  minutes.    on 03/20/2019 at 11:49 PM  Webb Silversmith, DNP, FNP-BC [facility] Nurse Practitioner Between 7am to 6pm - Pager [phone]  After 6pm go to [email or web address] - password [email or web address]  [facility]  Office  [phone]  CC: Primary care physician; Patient, No Pcp Per

## 2019-03-20 NOTE — Consult Note (Signed)
Sara Berger, Estrella [id number] December 05, 1990 Earleen Newport, MD  Reason for Consult: oropharyngeal abscess  HPI: 28 y.o. female presents to ED with worsening odynophagia and left neck swelling.  Seen several days ago and diagnosed with Strep throat and given a Z-pack.  Patient reports that the pain and swelling have increased and she has had little PO intake since prior ER visit.  Difficulty tolerating secretions and nausea/vomitting.  Presented to ED and given findings, CT scan of neck was performed.  This demonstrated a left sublingual abscess that connected to the left base of tongue.  Patient denies breathing difficulty.  Reports limited improvement since IV antibiotics and steroids.  Allergies:  Allergies  Allergen Reactions  . Penicillins     ROS: Review of systems normal other than 12 systems except per HPI.  PMH:  Past Medical History:  Diagnosis Date  . Depression   . IBS (irritable bowel syndrome)   . Strep throat     FH:  Family History  Problem Relation Age of Onset  . Breast cancer Maternal Grandmother   . Colon cancer Maternal Grandfather   . Diabetes Mother   . Congestive Heart Failure Mother   . Heart attack Mother        5 heart attacks within 13 months  . Diabetes Maternal Uncle   . Heart attack Maternal Uncle 43    SH:  Social History   Socioeconomic History  . Marital status: Single    Spouse name: Not on file  . Number of children: Not on file  . Years of education: Not on file  . Highest education level: Not on file  Occupational History  . Not on file  Social Needs  . Financial resource strain: Not on file  . Food insecurity    Worry: Not on file    Inability: Not on file  . Transportation needs    Medical: Not on file    Non-medical: Not on file  Tobacco Use  . Smoking status: Former Research scientist (life sciences)  . Smokeless tobacco: Never Used  Substance and Sexual Activity  . Alcohol use: Yes    Alcohol/week: 6.0 standard drinks    Types: 6 Cans of  beer per week  . Drug use: Yes    Comment: prescribed percocet  . Sexual activity: Yes    Birth control/protection: None  Lifestyle  . Physical activity    Days per week: Not on file    Minutes per session: Not on file  . Stress: Not on file  Relationships  . Social Herbalist on phone: Not on file    Gets together: Not on file    Attends religious service: Not on file    Active member of club or organization: Not on file    Attends meetings of clubs or organizations: Not on file    Relationship status: Not on file  . Intimate partner violence    Fear of current or ex partner: Not on file    Emotionally abused: Not on file    Physically abused: Not on file    Forced sexual activity: Not on file  Other Topics Concern  . Not on file  Social History Narrative  . Not on file    PSH:  Past Surgical History:  Procedure Laterality Date  . CHOLECYSTECTOMY    . COLONOSCOPY      Physical  Exam:  GEN- NAD supine in bed, but bouts of crying with swallowing NEURO-  CN 2-12  grossly intact and symmetric. EARS-  External ears clear NOSE-  Clear anteriorly OC/OP- left posterior floor of mouth swelling and tenderness NECK- extreme tenderness along left submental and left level 1/2 CARD-  RRR RESP- unlabored and regular  Procedure:  Trans-nasal flexible laryngoscopy-  After verbal consent was obtained topical Afrin and Viscous Lidocaine 2% was spray into nasal cavities bilaterally.  A flexible fiberoptic laryngoscope was inserted into the patient's left nasal cavity.  This demonstrated normal appearing nasal cavity, nasopharynx, and pharynx.  The patient's left base of tongue is mildly enlarged and edematous with punctate area of purulence noted laterally.  Airway widely patent and no involvment of epiglottis or lateral pharyngeal wall.  Tongue base is able to be protruded away from epiglottis with protrusion of tongue.  CT- Left base of tongue/sublingual abscess 2x 0.8x1.5cm    A/P: Lingual tonsillitis with sublingual abscess  Plan:  Recommend increasing Clindamycin to 900mg  Q6.  Continue Decadron 10mg  q8 for several more doses.  Cleared for liquids/soft diet.  Recommend pain control given discomfort.  Area is apparently auto-draining already and at this point would not need surgical intervention or drainage.  Will recheck in a.m.  Khyli Swaim 03/20/2019 9:18 PM

## 2019-03-20 NOTE — ED Notes (Signed)
See triage note  Was seen couple of days ago with sore throat  dx'd with strept  States swelling is worse on left side and having some swelling under tongue

## 2019-03-20 NOTE — ED Notes (Signed)
Pt appear comfortable, dozing off when this RN entered room. Pt state pain remains 9/10

## 2019-03-20 NOTE — ED Notes (Signed)
Transport to floor room 234.AS 

## 2019-03-20 NOTE — ED Triage Notes (Signed)
Patient seen on 03/18/19 and diagnosed with strep. Patient back today due to pain not decreasing

## 2019-03-20 NOTE — ED Provider Notes (Signed)
Patient was seen and examined by me.  The submental area is firm and tender, she has some adenopathy noted.  Patient describes exquisite pain.  She has received IV clindamycin and dexamethasone.  I will also order some pain medicine for her.  ENT will evaluate her for possible airway compromise.   Earleen Newport, MD 03/20/19 2059

## 2019-03-20 NOTE — ED Notes (Signed)
Pt up to use the bathroom. Fluids complete. Pt complains of vomiting up abnormal substances. RN notified.

## 2019-03-20 NOTE — ED Provider Notes (Signed)
[facility] Emergency Department Provider Note  ____________________________________________  Time seen: Approximately 7:56 PM  I have reviewed the triage vital signs and the nursing notes.   HISTORY  Chief Complaint Sore Throat    HPI Sara Berger is a 28 y.o. female presents  presents to the emergency department with swelling at the left neck, worsening pharyngitis and some mild pain underneath the tongue.  Patient states that she was diagnosed with group A strep pharyngitis on 03/18/2019.  She was prescribed azithromycin due to penicillin allergy.  Patient is concerned as she has not experienced significant improvement. She has noticed swelling at left neck.  She has had 1-2 episodes of emesis at home and states that she feels like she needs to use an emesis bag for her oral secretions.  She denies chest pain, chest tightness or abdominal pain.  No other alleviating measures have been attempted.        Past Medical History:  Diagnosis Date  . Depression   . IBS (irritable bowel syndrome)   . Strep throat     Patient Active Problem List   Diagnosis Date Noted  . Peptic ulcer 07/03/2018    Past Surgical History:  Procedure Laterality Date  . CHOLECYSTECTOMY    . COLONOSCOPY      Prior to Admission medications   Medication Sig Start Date End Date Taking? Authorizing Provider  azithromycin (ZITHROMAX Z-PAK) 250 MG tablet Take 2 tablets (500 mg) on  Day 1,  followed by 1 tablet (250 mg) once daily on Days 2 through 5. 03/18/19 03/23/19  Joni Reining, PA-C  lamoTRIgine (LAMICTAL) 25 MG tablet Take 25 mg by mouth 2 (two) times daily.  05/27/18   [provider]  lidocaine (XYLOCAINE) 2 % solution Use as directed 5 mLs in the mouth or throat every 6 (six) hours as needed for mouth pain. 03/18/19   Joni Reining, PA-C    Allergies Penicillins  Family History  Problem Relation Age of Onset  . Breast cancer Maternal Grandmother   .  Colon cancer Maternal Grandfather   . Diabetes Mother   . Congestive Heart Failure Mother   . Heart attack Mother        5 heart attacks within 13 months  . Diabetes Maternal Uncle   . Heart attack Maternal Uncle 32    Social History Social History   Tobacco Use  . Smoking status: Former Games developer  . Smokeless tobacco: Never Used  Substance Use Topics  . Alcohol use: Yes    Alcohol/week: 6.0 standard drinks    Types: 6 Cans of beer per week  . Drug use: Yes    Comment: prescribed percocet     Review of Systems  Constitutional: No fever/chills Eyes: No visual changes. No discharge ENT: Patient has pharyngitis. Cardiovascular: no chest pain. Respiratory: no cough. No SOB. Gastrointestinal: No abdominal pain.  No nausea, no vomiting.  No diarrhea.  No constipation. Genitourinary: Negative for dysuria. No hematuria Musculoskeletal: Negative for musculoskeletal pain. Skin: Negative for rash, abrasions, lacerations, ecchymosis. Neurological: Negative for headaches, focal weakness or numbness.   ____________________________________________   PHYSICAL EXAM:  VITAL SIGNS: ED Triage Vitals  Enc Vitals Group     BP 03/20/19 1727 138/85     Pulse Rate 03/20/19 1727 92     Resp 03/20/19 1727 16     Temp 03/20/19 1727 98.4 F (36.9 C)     Temp Source 03/20/19 1727 Oral     SpO2  03/20/19 1727 99 %     Weight 03/20/19 1728 140 lb (63.5 kg)     Height 03/20/19 1728 5\' 5"  (1.651 m)     Head Circumference --      Peak Flow --      Pain Score 03/20/19 1728 9     Pain Loc --      Pain Edu? --      Excl. in [place]? --      Constitutional: Alert and oriented. Well appearing and in no acute distress. Eyes: Conjunctivae are normal. PERRL. EOMI. Head: Atraumatic. ENT:      Ears: TMs are pearly.      Nose: No congestion/rhinnorhea.      Mouth/Throat: Mucous membranes are moist.  Patient has very little tonsillar matter.  Posterior pharynx is erythematous without significant  tonsillar exudate.  Uvula is midline.  Patient reports mild tenderness to palpation underneath the tongue. She has left sided submental fullness to palpation.  Neck: No stridor.  No cervical spine tenderness to palpation. Hematological/Lymphatic/Immunilogical: Palpable left-sided anterior cervical lymphadenopathy. Cardiovascular: Normal rate, regular rhythm. Normal S1 and S2.  Good peripheral circulation. Respiratory: Normal respiratory effort without tachypnea or retractions. Lungs CTAB. Good air entry to the bases with no decreased or absent breath sounds. Gastrointestinal: Bowel sounds 4 quadrants. Soft and nontender to palpation. No guarding or rigidity. No palpable masses. No distention. No CVA tenderness. Musculoskeletal: Full range of motion to all extremities. No gross deformities appreciated. Neurologic:  Normal speech and language. No gross focal neurologic deficits are appreciated.  Skin:  Skin is warm, dry and intact. No rash noted. Psychiatric: Mood and affect are normal. Speech and behavior are normal. Patient exhibits appropriate insight and judgement.   ____________________________________________   LABS (all labs ordered are listed, but only abnormal results are displayed)  Labs Reviewed  CBC WITH DIFFERENTIAL/PLATELET - Abnormal; Notable for the following components:      Result Value   WBC 14.5 (*)    Neutro Abs 11.7 (*)    Monocytes Absolute 1.2 (*)    Abs Immature Granulocytes 0.11 (*)    All other components within normal limits  COMPREHENSIVE METABOLIC PANEL - Abnormal; Notable for the following components:   ALT 93 (*)    Total Bilirubin 1.5 (*)    All other components within normal limits  SARS CORONAVIRUS 2 BY RT PCR (HOSPITAL ORDER, [facility])   ____________________________________________  EKG   ____________________________________________  RADIOLOGY I personally viewed and evaluated these images as part of my medical  decision making, as well as reviewing the written report by the radiologist.    Ct Soft Tissue Neck W Contrast  Result Date: 03/20/2019 CLINICAL DATA:  Sore throat and stridor. EXAM: CT NECK WITH CONTRAST TECHNIQUE: Multidetector CT imaging of the neck was performed using the standard protocol following the bolus administration of intravenous contrast. CONTRAST:  19mL OMNIPAQUE IOHEXOL 300 MG/ML  SOLN COMPARISON:  None. FINDINGS: PHARYNX AND LARYNX: --Nasopharynx: Fossae of Rosenmuller are clear. Normal adenoid tonsils for age. --Oral cavity and oropharynx: There is a large amount of swelling in the sublingual space with a fluid collection measuring 2.9 x 1.5 x 0.8 cm likely arises from the lingual tonsils and extends into the left sublingual space. --Hypopharynx: There is crowding of the vallecular with effacement of the left parapharyngeal fat. --Larynx: The epiglottis is normal. Normal area of the glottic and vocal folds. --Retropharyngeal space: No abscess, effusion or lymphadenopathy. SALIVARY GLANDS: --Parotid: No  mass lesion or inflammation. No sialolithiasis or ductal dilatation. --Submandibular: Submandibular glands are enlarged. --Sublingual: Normal. No ranula or other visible lesion of the base of tongue and floor of mouth. THYROID: Normal. LYMPH NODES: There are multiple enlarged left level 2A cervical lymph nodes with numerous subcentimeter nodes throughout the left cervical chain. VASCULAR: Major cervical vessels are patent. LIMITED INTRACRANIAL: Normal. VISUALIZED ORBITS: Normal. MASTOIDS AND VISUALIZED PARANASAL SINUSES: Right maxillary retention cyst. No mastoid or middle ear effusion. SKELETON: No bony spinal canal stenosis. No lytic or blastic lesions. UPPER CHEST: Multifocal parenchymal opacity in the right upper lobe, likely infectious. OTHER: None. IMPRESSION: 1. Floor of mouth infection with left sublingual space abscess measuring up to 2.9 x 1.5 x 0.8 cm, likely arising from the lingual  tonsils, extending into the left sublingual space. 2. Multiple enlarged left level 2A cervical lymph nodes, likely reactive. 3. Multifocal parenchymal opacity in the right upper lobe, likely early pneumonia, possibly secondary to aspiration. Electronically Signed   By: Deatra Robinson M.D.   On: 03/20/2019 20:09    ____________________________________________    PROCEDURES  Procedure(s) performed:    Procedures    Medications  dexamethasone (DECADRON) injection 10 mg (10 mg Intravenous Given 03/20/19 1806)  clindamycin (CLEOCIN) IVPB 600 mg (0 mg Intravenous Stopped 03/20/19 1854)  sodium chloride 0.9 % bolus 1,000 mL (0 mLs Intravenous Stopped 03/20/19 2041)  iohexol (OMNIPAQUE) 300 MG/ML solution 75 mL (75 mLs Intravenous Contrast Given 03/20/19 1854)  HYDROmorphone (DILAUDID) injection 0.5 mg (0.5 mg Intravenous Given 03/20/19 2119)  lidocaine (XYLOCAINE) 2 % jelly 1 application (1 application Other Given by Other 03/20/19 2123)  oxymetazoline (AFRIN) 0.05 % nasal spray 1 spray (1 spray Each Nare Given by Other 03/20/19 2123)  clindamycin (CLEOCIN) IVPB 300 mg (0 mg Intravenous Stopped 03/20/19 2254)  HYDROmorphone (DILAUDID) injection 0.5 mg (0.5 mg Intravenous Given 03/20/19 2210)     ____________________________________________   INITIAL IMPRESSION / ASSESSMENT AND PLAN / ED COURSE  Pertinent labs & imaging results that were available during my care of the patient were reviewed by me and considered in my medical decision making (see chart for details).  Review of the [place] CSRS was performed in accordance of the [facility] prior to dispensing any controlled drugs.           Assessment and plan Pharyngitis 28 year old female presents to the emergency department with 8 out of 10 acute and aching pharyngitis that has occurred for the past 3 to 5 days.  Vital signs are reassuring at triage.  On physical exam, posterior pharynx appeared erythematous and patient had some mild tenderness to  palpation underneath the tongue.  Patient had palpable left-sided anterior cervical lymphadenopathy on exam and submental fullness.  Differential diagnosis includes tonsillitis, group A strep pharyngitis, Ludwig's.   CBC and CMP were obtained in the emergency department.  Patient had leukocytosis, WBC 14.5 with associated left shift.  CT soft tissue neck was obtained which was concerning for a sublingual abscess.  Patient was given IV Decadron and clindamycin in the emergency department given penicillin allergy.  Otolaryngologist on-call, Dr. Andee Poles was consulted who personally came and evaluated patient for airway examination.  Patient was transitioned to main side of the emergency department for further care and management.  Patient report was given to Dr. Daryel November.  Patient was admitted from main side of the emergency department by Dr. Sunday Corn.   ____________________________________________  FINAL CLINICAL IMPRESSION(S) / ED DIAGNOSES  Final diagnoses:  Sublingual abscess  NEW MEDICATIONS STARTED DURING THIS VISIT:  ED Discharge Orders    None          This chart was dictated using voice recognition software/Dragon. Despite best efforts to proofread, errors can occur which can change the meaning. Any change was purely unintentional.    Orvil FeilWoods, Garry Nicolini M, PA-C 03/20/19 2301    Arnaldo NatalMalinda, Paul F, MD 03/21/19 2136

## 2019-03-20 NOTE — ED Notes (Signed)
Dr. Jeannie Fend in room with flexible laryngoscope. Pt tolerating well.

## 2019-03-20 NOTE — ED Notes (Signed)
ED TO INPATIENT HANDOFF REPORT  ED Nurse Name and Phone #:  Lurena JoinerRebecca [phone]  S Name/Age/Gender Sara Berger 28 y.o. female Room/Bed: ED17A/ED17A  Code Status   Code Status: Full Code  Home/SNF/Other Home Patient oriented to: self, place, time and situation Is this baseline? Yes   Triage Complete: Triage complete  Chief Complaint Sore throat  Triage Note Patient seen on 03/18/19 and diagnosed with strep. Patient back today due to pain not decreasing   Allergies Allergies  Allergen Reactions  . Penicillins     Level of Care/Admitting Diagnosis ED Disposition    ED Disposition Condition Comment   Admit  [facility] [[id number]]  Level of Care: Telemetry [5]  Covid Evaluation: Asymptomatic Screening Protocol (No Symptoms)  Diagnosis: Lingual tonsillitis [[id number]][[id number]]  Admitting Physician: Jimmye NormanUMA, ELIZABETH ACHIENG [[id number]]  Attending Physician: Webb SilversmithUMA, ELIZABETH ACHIENG [[id number]]  Estimated length of stay: past midnight tomorrow  Certification:: I certify this patient will need inpatient services for at least 2 midnights  PT Class (Do Not Modify): Inpatient [101]  PT Acc Code (Do Not Modify): Private [1]       B Medical/Surgery History Past Medical History:  Diagnosis Date  . Depression   . IBS (irritable bowel syndrome)   . Strep throat    Past Surgical History:  Procedure Laterality Date  . CHOLECYSTECTOMY    . COLONOSCOPY       A IV Location/Drains/Wounds Patient Lines/Drains/Airways Status   Active Line/Drains/Airways    Name:   Placement date:   Placement time:   Site:   Days:   Peripheral IV 03/20/19 Left Antecubital   03/20/19    1805    Antecubital   less than 1          Intake/Output Last 24 hours  Intake/Output Summary (Last 24 hours) at 03/20/2019 2317 Last data filed at 03/20/2019 2254 Gross per 24 hour  Intake 1100 ml  Output -  Net 1100 ml    Labs/Imaging Results for orders placed or performed during  the hospital encounter of 03/20/19 (from the past 48 hour(s))  CBC with Differential     Status: Abnormal   Collection Time: 03/20/19  6:07 PM  Result Value Ref Range   WBC 14.5 (H) 4.0 - 10.5 K/uL   RBC 4.01 3.87 - 5.11 MIL/uL   Hemoglobin 13.0 12.0 - 15.0 g/dL   HCT 56.237.7 13.036.0 - 86.546.0 %   MCV 94.0 80.0 - 100.0 fL   MCH 32.4 26.0 - 34.0 pg   MCHC 34.5 30.0 - 36.0 g/dL   RDW 78.411.8 69.611.5 - 29.515.5 %   Platelets 291 150 - 400 K/uL   nRBC 0.0 0.0 - 0.2 %   Neutrophils Relative % 81 %   Neutro Abs 11.7 (H) 1.7 - 7.7 K/uL   Lymphocytes Relative 10 %   Lymphs Abs 1.4 0.7 - 4.0 K/uL   Monocytes Relative 8 %   Monocytes Absolute 1.2 (H) 0.1 - 1.0 K/uL   Eosinophils Relative 0 %   Eosinophils Absolute 0.1 0.0 - 0.5 K/uL   Basophils Relative 0 %   Basophils Absolute 0.0 0.0 - 0.1 K/uL   Immature Granulocytes 1 %   Abs Immature Granulocytes 0.11 (H) 0.00 - 0.07 K/uL    Comment: Performed at [facility], [address] [place], [place] [id number]  Comprehensive metabolic panel     Status: Abnormal   Collection Time: 03/20/19  6:07 PM  Result Value Ref Range  Sodium 139 135 - 145 mmol/L   Potassium 3.6 3.5 - 5.1 mmol/L   Chloride 105 98 - 111 mmol/L   CO2 23 22 - 32 mmol/L   Glucose, Bld 87 70 - 99 mg/dL   BUN 11 6 - 20 mg/dL   Creatinine, Ser 0.52 0.44 - 1.00 mg/dL   Calcium 9.2 8.9 - 10.3 mg/dL   Total Protein 7.3 6.5 - 8.1 g/dL   Albumin 3.7 3.5 - 5.0 g/dL   AST 33 15 - 41 U/L   ALT 93 (H) 0 - 44 U/L   Alkaline Phosphatase 112 38 - 126 U/L   Total Bilirubin 1.5 (H) 0.3 - 1.2 mg/dL   GFR calc non Af Amer >60 >60 mL/min   GFR calc Af Amer >60 >60 mL/min   Anion gap 11 5 - 15    Comment: Performed at [facility], [address] [place],  [address]   Ct Soft Tissue Neck W Contrast  Result Date: 03/20/2019 CLINICAL DATA:  Sore throat and stridor. EXAM: CT NECK WITH CONTRAST TECHNIQUE: Multidetector CT imaging of the neck was performed using the standard  protocol following the bolus administration of intravenous contrast. CONTRAST:  3mL OMNIPAQUE IOHEXOL 300 MG/ML  SOLN COMPARISON:  None. FINDINGS: PHARYNX AND LARYNX: --Nasopharynx: Fossae of Rosenmuller are clear. Normal adenoid tonsils for age. --Oral cavity and oropharynx: There is a large amount of swelling in the sublingual space with a fluid collection measuring 2.9 x 1.5 x 0.8 cm likely arises from the lingual tonsils and extends into the left sublingual space. --Hypopharynx: There is crowding of the vallecular with effacement of the left parapharyngeal fat. --Larynx: The epiglottis is normal. Normal area of the glottic and vocal folds. --Retropharyngeal space: No abscess, effusion or lymphadenopathy. SALIVARY GLANDS: --Parotid: No mass lesion or inflammation. No sialolithiasis or ductal dilatation. --Submandibular: Submandibular glands are enlarged. --Sublingual: Normal. No ranula or other visible lesion of the base of tongue and floor of mouth. THYROID: Normal. LYMPH NODES: There are multiple enlarged left level 2A cervical lymph nodes with numerous subcentimeter nodes throughout the left cervical chain. VASCULAR: Major cervical vessels are patent. LIMITED INTRACRANIAL: Normal. VISUALIZED ORBITS: Normal. MASTOIDS AND VISUALIZED PARANASAL SINUSES: Right maxillary retention cyst. No mastoid or middle ear effusion. SKELETON: No bony spinal canal stenosis. No lytic or blastic lesions. UPPER CHEST: Multifocal parenchymal opacity in the right upper lobe, likely infectious. OTHER: None. IMPRESSION: 1. Floor of mouth infection with left sublingual space abscess measuring up to 2.9 x 1.5 x 0.8 cm, likely arising from the lingual tonsils, extending into the left sublingual space. 2. Multiple enlarged left level 2A cervical lymph nodes, likely reactive. 3. Multifocal parenchymal opacity in the right upper lobe, likely early pneumonia, possibly secondary to aspiration. Electronically Signed   By: Ulyses Jarred M.D.    On: 03/20/2019 20:09    Pending Labs Unresulted Labs (From admission, onward)    Start     Ordered   03/20/19 2256  HIV Antibody (routine testing w rflx)  (HIV Antibody (Routine testing w reflex) panel)  Once,   STAT     03/20/19 2301   03/20/19 2256  HIV4GL Save Tube  (HIV Antibody (Routine testing w reflex) panel)  Once,   STAT     03/20/19 2301   03/20/19 2208  SARS Coronavirus 2 by RT PCR (hospital order, performed in [facility] hospital lab) Nasopharyngeal Nasopharyngeal Swab  (Symptomatic/High Risk of Exposure/Tier 1 Patients Labs with Precautions)  Once,  STAT    Question Answer Comment  Is this test for diagnosis or screening Diagnosis of ill patient   Symptomatic for COVID-19 as defined by CDC Yes   Date of Symptom Onset 03/20/2019   Hospitalized for COVID-19 No   Admitted to ICU for COVID-19 No   Previously tested for COVID-19 No   Resident in a congregate (group) care setting No   Employed in healthcare setting No   Pregnant No      03/20/19 2207          Vitals/Pain Today's Vitals   03/20/19 2128 03/20/19 2200 03/20/19 2230 03/20/19 2236  BP:  (!) 144/91 (!) 147/92   Pulse:  86 84   Resp:  14 10   Temp:      TempSrc:      SpO2:  95% 99%   Weight:      Height:      PainSc: 8    9     Isolation Precautions No active isolations  Medications Medications  0.9 %  sodium chloride infusion (has no administration in time range)  ketorolac (TORADOL) 30 MG/ML injection 15 mg (has no administration in time range)  dexamethasone (DECADRON) injection 10 mg (has no administration in time range)  HYDROmorphone (DILAUDID) injection 1 mg (has no administration in time range)  dexamethasone (DECADRON) injection 10 mg (10 mg Intravenous Given 03/20/19 1806)  clindamycin (CLEOCIN) IVPB 600 mg (0 mg Intravenous Stopped 03/20/19 1854)  sodium chloride 0.9 % bolus 1,000 mL (0 mLs Intravenous Stopped 03/20/19 2041)  iohexol (OMNIPAQUE) 300 MG/ML solution 75 mL (75 mLs  Intravenous Contrast Given 03/20/19 1854)  HYDROmorphone (DILAUDID) injection 0.5 mg (0.5 mg Intravenous Given 03/20/19 2119)  lidocaine (XYLOCAINE) 2 % jelly 1 application (1 application Other Given by Other 03/20/19 2123)  oxymetazoline (AFRIN) 0.05 % nasal spray 1 spray (1 spray Each Nare Given by Other 03/20/19 2123)  clindamycin (CLEOCIN) IVPB 300 mg (0 mg Intravenous Stopped 03/20/19 2254)  HYDROmorphone (DILAUDID) injection 0.5 mg (0.5 mg Intravenous Given 03/20/19 2210)    Mobility walks Low fall risk   Focused Assessments Cardiac Assessment Handoff:  Cardiac Rhythm: Normal sinus rhythm No results found for: CKTOTAL, CKMB, CKMBINDEX, TROPONINI No results found for: DDIMER Does the Patient currently have chest pain? No      R Recommendations: See Admitting Provider Note  Report given to:   Additional Notes:

## 2019-03-21 LAB — HIV ANTIBODY (ROUTINE TESTING W REFLEX): HIV Screen 4th Generation wRfx: NONREACTIVE

## 2019-03-21 MED ORDER — OXYCODONE HCL 5 MG PO TABS
10.0000 mg | ORAL_TABLET | Freq: Four times a day (QID) | ORAL | Status: DC | PRN
  Administered 2019-03-21 – 2019-03-22 (×2): 10 mg via ORAL
  Filled 2019-03-21 (×2): qty 2

## 2019-03-21 MED ORDER — CLINDAMYCIN PHOSPHATE 900 MG/50ML IV SOLN
900.0000 mg | Freq: Four times a day (QID) | INTRAVENOUS | Status: DC
  Administered 2019-03-21 – 2019-03-22 (×6): 900 mg via INTRAVENOUS
  Filled 2019-03-21 (×8): qty 50

## 2019-03-21 MED ORDER — LORAZEPAM 2 MG/ML IJ SOLN
1.0000 mg | Freq: Four times a day (QID) | INTRAMUSCULAR | Status: DC | PRN
  Administered 2019-03-21 (×2): 1 mg via INTRAVENOUS
  Filled 2019-03-21 (×2): qty 1

## 2019-03-21 MED ORDER — OXYCODONE HCL 5 MG PO TABS
10.0000 mg | ORAL_TABLET | Freq: Four times a day (QID) | ORAL | Status: DC | PRN
  Administered 2019-03-21 (×2): 10 mg via ORAL
  Filled 2019-03-21 (×2): qty 2

## 2019-03-21 MED ORDER — MORPHINE SULFATE (PF) 2 MG/ML IV SOLN
2.0000 mg | INTRAVENOUS | Status: DC | PRN
  Administered 2019-03-21 – 2019-03-22 (×2): 2 mg via INTRAVENOUS
  Filled 2019-03-21 (×2): qty 1

## 2019-03-21 MED ORDER — ACETAMINOPHEN 325 MG PO TABS
650.0000 mg | ORAL_TABLET | Freq: Four times a day (QID) | ORAL | Status: DC | PRN
  Administered 2019-03-22: 650 mg via ORAL
  Filled 2019-03-21: qty 2

## 2019-03-21 NOTE — Progress Notes (Addendum)
Patient started crying when giving her evening meds. Patient stated "I hate hospitals and I am very anxious". She does not want to take xanax as she said it did not help earlier and she doesn't take it for anxiety at home. She is threatening to leave the hospital tonight. I notified MD and charge nurse. Both are OK with her significant other staying the night for support. I educated patient on visitor rules and guidelines per hospital stay.

## 2019-03-21 NOTE — Progress Notes (Signed)
.. 03/21/2019 8:34 AM  Sara Berger [id number]   Temp:  [98.4 F (36.9 C)-99.6 F (37.6 C)] 98.6 F (37 C) (10/09 0732) Pulse Rate:  [66-101] 76 (10/09 0732) Resp:  [10-20] 20 (10/09 0732) BP: (127-148)/(85-97) 135/90 (10/09 0732) SpO2:  [95 %-100 %] 100 % (10/09 0732) Weight:  [56.5 kg-63.5 kg] 56.5 kg (10/09 0339),     Intake/Output Summary (Last 24 hours) at 03/21/2019 0834 Last data filed at 03/20/2019 2254 Gross per 24 hour  Intake 1100 ml  Output -  Net 1100 ml    Results for orders placed or performed during the hospital encounter of 03/20/19 (from the past 24 hour(s))  CBC with Differential     Status: Abnormal   Collection Time: 03/20/19  6:07 PM  Result Value Ref Range   WBC 14.5 (H) 4.0 - 10.5 K/uL   RBC 4.01 3.87 - 5.11 MIL/uL   Hemoglobin 13.0 12.0 - 15.0 g/dL   HCT 07.6 22.6 - 33.3 %   MCV 94.0 80.0 - 100.0 fL   MCH 32.4 26.0 - 34.0 pg   MCHC 34.5 30.0 - 36.0 g/dL   RDW 54.5 62.5 - 63.8 %   Platelets 291 150 - 400 K/uL   nRBC 0.0 0.0 - 0.2 %   Neutrophils Relative % 81 %   Neutro Abs 11.7 (H) 1.7 - 7.7 K/uL   Lymphocytes Relative 10 %   Lymphs Abs 1.4 0.7 - 4.0 K/uL   Monocytes Relative 8 %   Monocytes Absolute 1.2 (H) 0.1 - 1.0 K/uL   Eosinophils Relative 0 %   Eosinophils Absolute 0.1 0.0 - 0.5 K/uL   Basophils Relative 0 %   Basophils Absolute 0.0 0.0 - 0.1 K/uL   Immature Granulocytes 1 %   Abs Immature Granulocytes 0.11 (H) 0.00 - 0.07 K/uL  Comprehensive metabolic panel     Status: Abnormal   Collection Time: 03/20/19  6:07 PM  Result Value Ref Range   Sodium 139 135 - 145 mmol/L   Potassium 3.6 3.5 - 5.1 mmol/L   Chloride 105 98 - 111 mmol/L   CO2 23 22 - 32 mmol/L   Glucose, Bld 87 70 - 99 mg/dL   BUN 11 6 - 20 mg/dL   Creatinine, Ser 9.37 0.44 - 1.00 mg/dL   Calcium 9.2 8.9 - 34.2 mg/dL   Total Protein 7.3 6.5 - 8.1 g/dL   Albumin 3.7 3.5 - 5.0 g/dL   AST 33 15 - 41 U/L   ALT 93 (H) 0 - 44 U/L   Alkaline Phosphatase 112 38 -  126 U/L   Total Bilirubin 1.5 (H) 0.3 - 1.2 mg/dL   GFR calc non Af Amer >60 >60 mL/min   GFR calc Af Amer >60 >60 mL/min   Anion gap 11 5 - 15  SARS Coronavirus 2 by RT PCR (hospital order, performed in [facility] hospital lab) Nasopharyngeal Nasopharyngeal Swab     Status: None   Collection Time: 03/20/19 10:37 PM   Specimen: Nasopharyngeal Swab  Result Value Ref Range   SARS Coronavirus 2 NEGATIVE NEGATIVE    SUBJECTIVE:  Improved pain today.  Tolerating liquids.  Slept some.  Still reports pain with sawllowing  OBJECTIVE:  GEN-  NAD, supine in bed talking on phone without issue OC/OP- improved edema on floor of mouth and left posterior tongue, continues to be tender to palpation NECK-  Continued left anterior neck tenderness but improved edema and pain  IMPRESSION:  Left lingual tonsillitis  with auto-draining abscess  PLAN:  Discussed improvement with patient.  Recommend continuation of clindamycin and Decadron IV until oral intake improves and then switch to 300mg  QID and Sterapred DS 6 day taper.  Follow up as outpatient ENT this next Tuesday for repeat evaluation.    Sara Berger 03/21/2019, 8:34 AM

## 2019-03-21 NOTE — Progress Notes (Signed)
[facility] [place] at [facility]   PATIENT NAME: Sara Berger    MR#:  [id number]  DATE OF BIRTH:  June 07, 1991  SUBJECTIVE:  CHIEF COMPLAINT:   Chief Complaint  Patient presents with  . Sore Throat   -Still has significant tenderness on the left side of the neck and swallowing difficulty.  REVIEW OF SYSTEMS:  Review of Systems  Constitutional: Negative for chills, fever and malaise/fatigue.  HENT: Positive for sore throat. Negative for ear discharge, hearing loss and nosebleeds.        Left neck swelling and tenderness  Eyes: Negative for blurred vision and double vision.  Respiratory: Negative for cough, shortness of breath and wheezing.   Cardiovascular: Negative for chest pain, palpitations and leg swelling.  Gastrointestinal: Negative for abdominal pain, constipation, diarrhea, nausea and vomiting.  Genitourinary: Negative for dysuria.  Musculoskeletal: Negative for myalgias.  Neurological: Negative for dizziness, focal weakness, seizures, weakness and headaches.  Psychiatric/Behavioral: Negative for depression.    DRUG ALLERGIES:   Allergies  Allergen Reactions  . Penicillins     VITALS:  Blood pressure 135/90, pulse 76, temperature 98.6 F (37 C), temperature source Oral, resp. rate 20, height 5\' 5"  (1.651 m), weight 56.5 kg, last menstrual period 02/24/2019, SpO2 100 %.  PHYSICAL EXAMINATION:  Physical Exam  GENERAL:  28 y.o.-year-old patient lying in the bed with no acute distress.  EYES: Pupils equal, round, reactive to light and accommodation. No scleral icterus. Extraocular muscles intact.  HEENT: Head atraumatic, normocephalic.  Slightly enlarged left tonsil without any exudate noted. NECK: Swollen left side of the neck and severely tender to touch.  Supple, no jugular venous distention. No thyroid enlargement, no tenderness.  LUNGS: Normal breath sounds bilaterally, no wheezing, rales,rhonchi or crepitation. No use of accessory  muscles of respiration.  CARDIOVASCULAR: S1, S2 normal. No murmurs, rubs, or gallops.  ABDOMEN: Soft, nontender, nondistended. Bowel sounds present. No organomegaly or mass.  EXTREMITIES: No pedal edema, cyanosis, or clubbing.  NEUROLOGIC: Cranial nerves II through XII are intact. Muscle strength 5/5 in all extremities. Sensation intact. Gait not checked.  PSYCHIATRIC: The patient is alert and oriented x 3.  SKIN: No obvious rash, lesion, or ulcer.    LABORATORY PANEL:   CBC Recent Labs  Lab 03/20/19 1807  WBC 14.5*  HGB 13.0  HCT 37.7  PLT 291   ------------------------------------------------------------------------------------------------------------------  Chemistries  Recent Labs  Lab 03/20/19 1807  NA 139  K 3.6  CL 105  CO2 23  GLUCOSE 87  BUN 11  CREATININE 0.52  CALCIUM 9.2  AST 33  ALT 93*  ALKPHOS 112  BILITOT 1.5*   ------------------------------------------------------------------------------------------------------------------  Cardiac Enzymes No results for input(s): TROPONINI in the last 168 hours. ------------------------------------------------------------------------------------------------------------------  RADIOLOGY:  Ct Soft Tissue Neck W Contrast  Result Date: 03/20/2019 CLINICAL DATA:  Sore throat and stridor. EXAM: CT NECK WITH CONTRAST TECHNIQUE: Multidetector CT imaging of the neck was performed using the standard protocol following the bolus administration of intravenous contrast. CONTRAST:  8mL OMNIPAQUE IOHEXOL 300 MG/ML  SOLN COMPARISON:  None. FINDINGS: PHARYNX AND LARYNX: --Nasopharynx: Fossae of Rosenmuller are clear. Normal adenoid tonsils for age. --Oral cavity and oropharynx: There is a large amount of swelling in the sublingual space with a fluid collection measuring 2.9 x 1.5 x 0.8 cm likely arises from the lingual tonsils and extends into the left sublingual space. --Hypopharynx: There is crowding of the vallecular with  effacement of the left parapharyngeal fat. --Larynx: The epiglottis  is normal. Normal area of the glottic and vocal folds. --Retropharyngeal space: No abscess, effusion or lymphadenopathy. SALIVARY GLANDS: --Parotid: No mass lesion or inflammation. No sialolithiasis or ductal dilatation. --Submandibular: Submandibular glands are enlarged. --Sublingual: Normal. No ranula or other visible lesion of the base of tongue and floor of mouth. THYROID: Normal. LYMPH NODES: There are multiple enlarged left level 2A cervical lymph nodes with numerous subcentimeter nodes throughout the left cervical chain. VASCULAR: Major cervical vessels are patent. LIMITED INTRACRANIAL: Normal. VISUALIZED ORBITS: Normal. MASTOIDS AND VISUALIZED PARANASAL SINUSES: Right maxillary retention cyst. No mastoid or middle ear effusion. SKELETON: No bony spinal canal stenosis. No lytic or blastic lesions. UPPER CHEST: Multifocal parenchymal opacity in the right upper lobe, likely infectious. OTHER: None. IMPRESSION: 1. Floor of mouth infection with left sublingual space abscess measuring up to 2.9 x 1.5 x 0.8 cm, likely arising from the lingual tonsils, extending into the left sublingual space. 2. Multiple enlarged left level 2A cervical lymph nodes, likely reactive. 3. Multifocal parenchymal opacity in the right upper lobe, likely early pneumonia, possibly secondary to aspiration. Electronically Signed   By: Ulyses Jarred M.D.   On: 03/20/2019 20:09    EKG:  No orders found for this or any previous visit.  ASSESSMENT AND PLAN:   28 year old female with depression presents to hospital secondary to worsening left neck pain and difficulty swallowing.  1.  Left lingual tonsillitis with abscess-appreciate ENT consult. -Still has significant tenderness and pain. -Continue IV Decadron and IV clindamycin.  Abscess seems to be auto draining, so no plans for any surgical procedure at this time. -Continue clear liquid diet for another day or 2.  -Once able to tolerate diet, will change to oral antibiotics and discharge.  2.  DVT prophylaxis-patient is independent and ambulatory  3.  Mood disorder-restart Lamictal once able to swallow oral medications     All the records are reviewed and case discussed with Care Management/Social Workerr. Management plans discussed with the patient, family and they are in agreement.  CODE STATUS: Full code  TOTAL TIME TAKING CARE OF THIS PATIENT: 38 minutes.   POSSIBLE D/C IN 2-3 DAYS, DEPENDING ON CLINICAL CONDITION.   Gladstone Lighter M.D on 03/21/2019 at 11:22 AM  Between 7am to 6pm - Pager - [phone]  After 6pm go to [email or web address] - password [email or web address] [place] [facility]  Office  [phone]  CC: Primary care physician; Patient, No Pcp Per

## 2019-03-21 NOTE — Progress Notes (Signed)
Pt arrived from ED alert and oriented. VSS. pt educated on plan of care and given instructions on calling out or alerting staff if she felt SOB or the swelling in her throat was increasing.

## 2019-03-21 NOTE — Plan of Care (Signed)
?  Problem: Clinical Measurements: ?Goal: Will remain free from infection ?Outcome: Progressing ?Goal: Diagnostic test results will improve ?Outcome: Progressing ?  ?Problem: Coping: ?Goal: Level of anxiety will decrease ?Outcome: Progressing ?  ?Problem: Pain Managment: ?Goal: General experience of comfort will improve ?Outcome: Progressing ?  ?

## 2019-03-22 LAB — CBC
HCT: 35.9 % — ABNORMAL LOW (ref 36.0–46.0)
Hemoglobin: 12.4 g/dL (ref 12.0–15.0)
MCH: 32.1 pg (ref 26.0–34.0)
MCHC: 34.5 g/dL (ref 30.0–36.0)
MCV: 93 fL (ref 80.0–100.0)
Platelets: 342 10*3/uL (ref 150–400)
RBC: 3.86 MIL/uL — ABNORMAL LOW (ref 3.87–5.11)
RDW: 11.6 % (ref 11.5–15.5)
WBC: 15.6 10*3/uL — ABNORMAL HIGH (ref 4.0–10.5)
nRBC: 0 % (ref 0.0–0.2)

## 2019-03-22 MED ORDER — IBUPROFEN 600 MG PO TABS: 600.0000 mg | ORAL_TABLET | Freq: Three times a day (TID) | ORAL | 0 refills | Status: AC | PRN

## 2019-03-22 MED ORDER — CLINDAMYCIN HCL 300 MG PO CAPS: 300.0000 mg | ORAL_CAPSULE | Freq: Four times a day (QID) | ORAL | 0 refills | Status: AC

## 2019-03-22 MED ORDER — TRAMADOL HCL 50 MG PO TABS: 50.0000 mg | ORAL_TABLET | Freq: Four times a day (QID) | ORAL | 0 refills | Status: AC | PRN

## 2019-03-22 MED ORDER — PROBIOTIC 250 MG PO CAPS: 1.0000 | ORAL_CAPSULE | Freq: Every day | ORAL | 0 refills | Status: AC

## 2019-03-22 MED ORDER — PREDNISONE 10 MG (21) PO TBPK: ORAL_TABLET | ORAL | 0 refills | Status: AC

## 2019-03-22 NOTE — Plan of Care (Signed)
Discharge instructions provided to pt.  All questions addressed.  Understanding verified through teach back.  Awaiting transportation home via POV.   Problem: Education: Goal: Knowledge of General Education information will improve Description: Including pain rating scale, medication(s)/side effects and non-pharmacologic comfort measures Outcome: Completed/Met   Problem: Health Behavior/Discharge Planning: Goal: Ability to manage health-related needs will improve Outcome: Completed/Met   Problem: Clinical Measurements: Goal: Ability to maintain clinical measurements within normal limits will improve Outcome: Completed/Met Goal: Will remain free from infection Outcome: Completed/Met Goal: Diagnostic test results will improve Outcome: Completed/Met Goal: Respiratory complications will improve Outcome: Completed/Met Goal: Cardiovascular complication will be avoided Outcome: Completed/Met   Problem: Coping: Goal: Level of anxiety will decrease Outcome: Completed/Met   Problem: Pain Managment: Goal: General experience of comfort will improve Outcome: Completed/Met

## 2019-03-22 NOTE — Progress Notes (Signed)
Pt boyfriend came out to the nursing station to say pt fell. When nurse arrived into room pt in the bed holding her right wrist, crying stating she fell in the bathroom and fell on her right wrist. No liquids or clutter noted in the bathroom. Nurse suggested md may request xray Pt refused to have xray, stating she knows its not broke she has had the wrist broken before. Ice pack given for comfort. Prior to incident pt and boyfriend were arguing about his pain. They were yelling and using foul profanity. Rufina Falco NP notified.

## 2019-03-22 NOTE — Discharge Summary (Signed)
[facility] -  at [facility]   PATIENT NAME: Sara Berger    MR#:  [id number]  DATE OF BIRTH:  01-31-1991  DATE OF ADMISSION:  03/20/2019   ADMITTING PHYSICIAN: Jimmye Norman, NP  DATE OF DISCHARGE:  03/22/19  PRIMARY CARE PHYSICIAN: Patient, No Pcp Per   ADMISSION DIAGNOSIS:   Sublingual abscess [K12.2]  DISCHARGE DIAGNOSIS:   Active Problems:   Lingual tonsillitis   SECONDARY DIAGNOSIS:   Past Medical History:  Diagnosis Date  . Depression   . IBS (irritable bowel syndrome)   . Strep throat     HOSPITAL COURSE:   28 year old female with depression presents to hospital secondary to worsening left neck pain and difficulty swallowing.  1.  Left lingular tonsillitis with abscess-appreciate ENT consult. -CT of the neck showing left floor of the mouth 2 cm abscess which is self draining according to ENT. -Much improvement in symptoms clinically though patient still complains of significant pain on swallowing, oral cavity examination showed normal tongue normal size regular tonsils and no obstruction. -Received IV Decadron and IV clindamycin.  Abscess seems to be auto draining, so no plans for any surgical procedure at this time. -On a liquid diet, being discharged on oral clindamycin and prednisone taper pack. -Follow-up with ENT in 4 to 5 days as outpatient. -She has received IV narcotics in the hospital.  Being discharged on tramadol and ibuprofen  2.  Mood disorder-restart Lamictal once able to swallow oral medications  Will be discharged home today   DISCHARGE CONDITIONS:   Guarded  CONSULTS OBTAINED:   ENT by Dr. Andee Poles  DRUG ALLERGIES:   Allergies  Allergen Reactions  . Penicillins    DISCHARGE MEDICATIONS:   Allergies as of 03/22/2019      Reactions   Penicillins       Medication List    STOP taking these medications   azithromycin 250 MG tablet Commonly known as: Zithromax Z-Pak     TAKE these  medications   clindamycin 300 MG capsule Commonly known as: CLEOCIN Take 1 capsule (300 mg total) by mouth 4 (four) times daily for 8 days.   ibuprofen 600 MG tablet Commonly known as: ADVIL Take 1 tablet (600 mg total) by mouth every 8 (eight) hours as needed for up to 5 days for fever or moderate pain.   lamoTRIgine 25 MG tablet Commonly known as: LAMICTAL Take 25 mg by mouth 2 (two) times daily.   lidocaine 2 % solution Commonly known as: XYLOCAINE Use as directed 5 mLs in the mouth or throat every 6 (six) hours as needed for mouth pain.   predniSONE 10 MG (21) Tbpk tablet Commonly known as: STERAPRED UNI-PAK 21 TAB Please take as directed on the packet Start taking on: March 23, 2019   Probiotic 250 MG Caps Take 1 capsule by mouth daily for 10 days. While taking antibiotics   traMADol 50 MG tablet Commonly known as: Ultram Take 1 tablet (50 mg total) by mouth every 6 (six) hours as needed for severe pain.        DISCHARGE INSTRUCTIONS:   1.  ENT follow-up in 1 week  DIET:   Regular diet  ACTIVITY:   Activity as tolerated  OXYGEN:   Home Oxygen: No.  Oxygen Delivery: room air  DISCHARGE LOCATION:   home   If you experience worsening of your admission symptoms, develop shortness of breath, life threatening emergency, suicidal or homicidal thoughts you must seek medical attention immediately  by calling 911 or calling your MD immediately  if symptoms less severe.  You Must read complete instructions/literature along with all the possible adverse reactions/side effects for all the Medicines you take and that have been prescribed to you. Take any new Medicines after you have completely understood and accpet all the possible adverse reactions/side effects.   Please note  You were cared for by a hospitalist during your hospital stay. If you have any questions about your discharge medications or the care you received while you were in the hospital after you  are discharged, you can call the unit and asked to speak with the hospitalist on call if the hospitalist that took care of you is not available. Once you are discharged, your primary care physician will handle any further medical issues. Please note that NO REFILLS for any discharge medications will be authorized once you are discharged, as it is imperative that you return to your primary care physician (or establish a relationship with a primary care physician if you do not have one) for your aftercare needs so that they can reassess your need for medications and monitor your lab values.    On the day of Discharge:  VITAL SIGNS:   Blood pressure (!) 131/93, pulse 72, temperature 97.8 F (36.6 C), temperature source Oral, resp. rate 19, height 5\' 5"  (1.651 m), weight 56.5 kg, last menstrual period 02/24/2019, SpO2 100 %.  PHYSICAL EXAMINATION:    GENERAL:  28 y.o.-year-old patient lying in the bed with no acute distress.  EYES: Pupils equal, round, reactive to light and accommodation. No scleral icterus. Extraocular muscles intact.  HEENT: Head atraumatic, normocephalic.  Slightly enlarged left tonsil without any exudate noted. -Left ear examined, clear external ear canal and normal tympanic membrane NECK: improved swelling on the left side of the neck and still tender to touch.  Supple, no jugular venous distention. No thyroid enlargement, no tenderness.  LUNGS: Normal breath sounds bilaterally, no wheezing, rales,rhonchi or crepitation. No use of accessory muscles of respiration.  CARDIOVASCULAR: S1, S2 normal. No murmurs, rubs, or gallops.  ABDOMEN: Soft, nontender, nondistended. Bowel sounds present. No organomegaly or mass.  EXTREMITIES: No pedal edema, cyanosis, or clubbing.  NEUROLOGIC: Cranial nerves II through XII are intact. Muscle strength 5/5 in all extremities. Sensation intact. Gait not checked.  PSYCHIATRIC: The patient is alert and oriented x 3.  SKIN: No obvious rash, lesion,  or ulcer.   DATA REVIEW:   CBC Recent Labs  Lab 03/22/19 0402  WBC 15.6*  HGB 12.4  HCT 35.9*  PLT 342    Chemistries  Recent Labs  Lab 03/20/19 1807  NA 139  K 3.6  CL 105  CO2 23  GLUCOSE 87  BUN 11  CREATININE 0.52  CALCIUM 9.2  AST 33  ALT 93*  ALKPHOS 112  BILITOT 1.5*     Microbiology Results  Results for orders placed or performed during the hospital encounter of 03/20/19  SARS Coronavirus 2 by RT PCR (hospital order, performed in [facility] hospital lab) Nasopharyngeal Nasopharyngeal Swab     Status: None   Collection Time: 03/20/19 10:37 PM   Specimen: Nasopharyngeal Swab  Result Value Ref Range Status   SARS Coronavirus 2 NEGATIVE NEGATIVE Final    Comment: (NOTE) If result is NEGATIVE SARS-CoV-2 target nucleic acids are NOT DETECTED. The SARS-CoV-2 RNA is generally detectable in upper and lower  respiratory specimens during the acute phase of infection. The lowest  concentration of SARS-CoV-2 viral copies  this assay can detect is 250  copies / mL. A negative result does not preclude SARS-CoV-2 infection  and should not be used as the sole basis for treatment or other  patient management decisions.  A negative result may occur with  improper specimen collection / handling, submission of specimen other  than nasopharyngeal swab, presence of viral mutation(s) within the  areas targeted by this assay, and inadequate number of viral copies  (<250 copies / mL). A negative result must be combined with clinical  observations, patient history, and epidemiological information. If result is POSITIVE SARS-CoV-2 target nucleic acids are DETECTED. The SARS-CoV-2 RNA is generally detectable in upper and lower  respiratory specimens dur ing the acute phase of infection.  Positive  results are indicative of active infection with SARS-CoV-2.  Clinical  correlation with patient history and other diagnostic information is  necessary to determine patient infection  status.  Positive results do  not rule out bacterial infection or co-infection with other viruses. If result is PRESUMPTIVE POSTIVE SARS-CoV-2 nucleic acids MAY BE PRESENT.   A presumptive positive result was obtained on the submitted specimen  and confirmed on repeat testing.  While 2019 novel coronavirus  (SARS-CoV-2) nucleic acids may be present in the submitted sample  additional confirmatory testing may be necessary for epidemiological  and / or clinical management purposes  to differentiate between  SARS-CoV-2 and other Sarbecovirus currently known to infect humans.  If clinically indicated additional testing with an alternate test  methodology [phone]) is advised. The SARS-CoV-2 RNA is generally  detectable in upper and lower respiratory sp ecimens during the acute  phase of infection. The expected result is Negative. Fact Sheet for Patients:  StrictlyIdeas.no Fact Sheet for Healthcare Providers: BankingDealers.co.za This test is not yet approved or cleared by the [place] FDA and has been authorized for detection and/or diagnosis of SARS-CoV-2 by FDA under an Emergency Use Authorization (EUA).  This EUA will remain in effect (meaning this test can be used) for the duration of the COVID-19 declaration under Section 564(b)(1) of the Act, 21 U.S.C. section 360bbb-3(b)(1), unless the authorization is terminated or revoked sooner. Performed at [facility], [address] [place], [place] [address]     RADIOLOGY:  No results found.   Management plans discussed with the patient, family and they are in agreement.  CODE STATUS:     Code Status Orders  (From admission, onward)         Start     Ordered   03/20/19 2259  Full code  Continuous     03/20/19 2301        Code Status History    This patient has a current code status but no historical code status.   Advance Care Planning Activity      TOTAL  TIME TAKING CARE OF THIS PATIENT: 39  minutes.    Gladstone Lighter M.D on 03/22/2019 at 10:57 AM  Between 7am to 6pm - Pager - [phone]  After 6pm go to [email or web address] - Proofreader  [facility] [place] [facility]  Office  [phone]  CC: Primary care physician; Patient, No Pcp Per   Note: This dictation was prepared with Dragon dictation along with smaller phrase technology. Any transcriptional errors that result from this process are unintentional.

## 2019-09-15 ENCOUNTER — Emergency Department: Payer: PRIVATE HEALTH INSURANCE

## 2019-09-15 ENCOUNTER — Emergency Department
Admission: EM | Admit: 2019-09-15 | Discharge: 2019-09-15 | Disposition: A | Discharge status: Home / Self Care | Payer: PRIVATE HEALTH INSURANCE | Attending: Emergency Medicine | Admitting: Emergency Medicine

## 2019-09-15 ENCOUNTER — Encounter: Payer: Self-pay | Admitting: Emergency Medicine

## 2019-09-15 ENCOUNTER — Other Ambulatory Visit: Payer: Self-pay

## 2019-09-15 DIAGNOSIS — B9689 Other specified bacterial agents as the cause of diseases classified elsewhere: Secondary | ICD-10-CM | POA: Insufficient documentation

## 2019-09-15 DIAGNOSIS — R110 Nausea: Secondary | ICD-10-CM | POA: Insufficient documentation

## 2019-09-15 DIAGNOSIS — Z87891 Personal history of nicotine dependence: Secondary | ICD-10-CM | POA: Insufficient documentation

## 2019-09-15 DIAGNOSIS — R1032 Left lower quadrant pain: Secondary | ICD-10-CM

## 2019-09-15 DIAGNOSIS — K529 Noninfective gastroenteritis and colitis, unspecified: Secondary | ICD-10-CM

## 2019-09-15 DIAGNOSIS — N760 Acute vaginitis: Secondary | ICD-10-CM | POA: Insufficient documentation

## 2019-09-15 LAB — CBC
HCT: 41 % (ref 36.0–46.0)
Hemoglobin: 14.8 g/dL (ref 12.0–15.0)
MCH: 33 pg (ref 26.0–34.0)
MCHC: 36.1 g/dL — ABNORMAL HIGH (ref 30.0–36.0)
MCV: 91.3 fL (ref 80.0–100.0)
Platelets: 304 10*3/uL (ref 150–400)
RBC: 4.49 MIL/uL (ref 3.87–5.11)
RDW: 11.6 % (ref 11.5–15.5)
WBC: 10 10*3/uL (ref 4.0–10.5)
nRBC: 0 % (ref 0.0–0.2)

## 2019-09-15 LAB — COMPREHENSIVE METABOLIC PANEL
ALT: 15 U/L (ref 0–44)
AST: 19 U/L (ref 15–41)
Albumin: 4.9 g/dL (ref 3.5–5.0)
Alkaline Phosphatase: 59 U/L (ref 38–126)
Anion gap: 12 (ref 5–15)
BUN: 15 mg/dL (ref 6–20)
CO2: 24 mmol/L (ref 22–32)
Calcium: 9.5 mg/dL (ref 8.9–10.3)
Chloride: 104 mmol/L (ref 98–111)
Creatinine, Ser: 0.65 mg/dL (ref 0.44–1.00)
GFR calc Af Amer: >60 mL/min (ref >60)
GFR calc non Af Amer: >60 mL/min (ref >60)
Glucose, Bld: 95 mg/dL (ref 70–99)
Potassium: 4 mmol/L (ref 3.5–5.1)
Sodium: 140 mmol/L (ref 135–145)
Total Bilirubin: 2.2 mg/dL — ABNORMAL HIGH (ref 0.3–1.2)
Total Protein: 8 g/dL (ref 6.5–8.1)

## 2019-09-15 LAB — URINALYSIS, COMPLETE (UACMP) WITH MICROSCOPIC
Bacteria, UA: NONE SEEN
Bilirubin Urine: NEGATIVE
Glucose, UA: NEGATIVE mg/dL
Hgb urine dipstick: NEGATIVE
Ketones, ur: 80 mg/dL — AB
Leukocytes,Ua: NEGATIVE
Nitrite: NEGATIVE
Protein, ur: NEGATIVE mg/dL
Specific Gravity, Urine: 1.021 (ref 1.005–1.030)
pH: 5 (ref 5.0–8.0)

## 2019-09-15 LAB — WET PREP, GENITAL
Sperm: NONE SEEN
Trich, Wet Prep: NONE SEEN
Yeast Wet Prep HPF POC: NONE SEEN

## 2019-09-15 LAB — CHLAMYDIA/NGC RT PCR (ARMC ONLY)
Chlamydia Tr: NOT DETECTED
Chlamydia Tr: NOT DETECTED
N gonorrhoeae: NOT DETECTED
N gonorrhoeae: NOT DETECTED

## 2019-09-15 LAB — POCT PREGNANCY, URINE: Preg Test, Ur: NEGATIVE

## 2019-09-15 LAB — LIPASE, BLOOD: Lipase: 24 U/L (ref 11–51)

## 2019-09-15 MED ORDER — HYDROMORPHONE HCL 1 MG/ML IJ SOLN
0.5000 mg | Freq: Once | INTRAMUSCULAR | Status: AC
  Administered 2019-09-15: 0.5 mg via INTRAVENOUS
  Filled 2019-09-15: qty 1

## 2019-09-15 MED ORDER — KETOROLAC TROMETHAMINE 30 MG/ML IJ SOLN
30.0000 mg | Freq: Once | INTRAMUSCULAR | Status: AC
  Administered 2019-09-15: 30 mg via INTRAMUSCULAR
  Filled 2019-09-15: qty 1

## 2019-09-15 MED ORDER — CIPROFLOXACIN HCL 500 MG PO TABS: 500.0000 mg | ORAL_TABLET | Freq: Two times a day (BID) | ORAL | 0 refills | Status: AC

## 2019-09-15 MED ORDER — ACETAMINOPHEN 500 MG PO TABS
1000.0000 mg | ORAL_TABLET | Freq: Once | ORAL | Status: AC
  Administered 2019-09-15: 1000 mg via ORAL
  Filled 2019-09-15: qty 2

## 2019-09-15 MED ORDER — IOHEXOL 300 MG/ML  SOLN
75.0000 mL | Freq: Once | INTRAMUSCULAR | Status: AC | PRN
  Administered 2019-09-15: 75 mL via INTRAVENOUS
  Filled 2019-09-15: qty 75

## 2019-09-15 MED ORDER — ONDANSETRON 4 MG PO TBDP: 4.0000 mg | ORAL_TABLET | Freq: Three times a day (TID) | ORAL | 0 refills | Status: AC | PRN

## 2019-09-15 MED ORDER — ONDANSETRON 4 MG PO TBDP
4.0000 mg | ORAL_TABLET | Freq: Once | ORAL | Status: AC
  Administered 2019-09-15: 4 mg via ORAL
  Filled 2019-09-15: qty 1

## 2019-09-15 MED ORDER — OXYCODONE HCL 5 MG PO TABS: 5.0000 mg | ORAL_TABLET | Freq: Three times a day (TID) | ORAL | 0 refills | Status: AC | PRN

## 2019-09-15 MED ORDER — ONDANSETRON HCL 4 MG/2ML IJ SOLN: 4.0000 mg | Freq: Once | INTRAMUSCULAR | Status: AC

## 2019-09-15 MED ORDER — METRONIDAZOLE 500 MG PO TABS: 500.0000 mg | ORAL_TABLET | Freq: Two times a day (BID) | ORAL | 0 refills | Status: AC

## 2019-09-15 MED ORDER — ONDANSETRON HCL 4 MG/2ML IJ SOLN
INTRAMUSCULAR | Status: AC
  Administered 2019-09-15: 4 mg via INTRAVENOUS
  Filled 2019-09-15: qty 2

## 2019-09-15 NOTE — ED Triage Notes (Signed)
Pt here for lower pelvic/abdominal pain. Started when she began her period Friday.  C/o NVD with it starting yesterday.  Denies pregnancy; reports 6 doctors told her she cannot have kid.  Has tried motrin.  No fever.

## 2019-09-15 NOTE — ED Provider Notes (Signed)
[facility] Emergency Department Provider Note  ____________________________________________   First MD Initiated Contact with Patient 09/15/19 1526     (approximate)  I have reviewed the triage vital signs and the nursing notes.   HISTORY  Chief Complaint Vaginal Bleeding    HPI Sara Berger is a 29 y.o. female with depression, irritable bowel who comes in with vaginal bleeding.  Patient stated that she had her normal period start a few days ago however she is had severe abdominal pain that is mostly on the left lower quadrant, constant, nothing making it better, nothing making it worse.  States that she was told she had a cyst before on that side.  Is been associated with some nausea.  No fevers.  Patient has been with the same partner for 4 years.  Denies concerns for STDs.          Past Medical History:  Diagnosis Date  . Depression   . IBS (irritable bowel syndrome)   . Strep throat     Patient Active Problem List   Diagnosis Date Noted  . Lingual tonsillitis 03/20/2019  . Peptic ulcer 07/03/2018    Past Surgical History:  Procedure Laterality Date  . CHOLECYSTECTOMY    . COLONOSCOPY      Prior to Admission medications   Medication Sig Start Date End Date Taking? Authorizing Provider  lamoTRIgine (LAMICTAL) 25 MG tablet Take 25 mg by mouth 2 (two) times daily.  05/27/18   [provider]  lidocaine (XYLOCAINE) 2 % solution Use as directed 5 mLs in the mouth or throat every 6 (six) hours as needed for mouth pain. 03/18/19   Sable Feil, PA-C  predniSONE (STERAPRED UNI-PAK 21 TAB) 10 MG (21) TBPK tablet Please take as directed on the packet 03/23/19   Gladstone Lighter, MD  traMADol (ULTRAM) 50 MG tablet Take 1 tablet (50 mg total) by mouth every 6 (six) hours as needed for severe pain. 03/22/19   Gladstone Lighter, MD    Allergies Penicillins  Family History  Problem Relation Age of Onset  . Breast cancer  Maternal Grandmother   . Colon cancer Maternal Grandfather   . Diabetes Mother   . Congestive Heart Failure Mother   . Heart attack Mother        5 heart attacks within 13 months  . Diabetes Maternal Uncle   . Heart attack Maternal Uncle 44    Social History Social History   Tobacco Use  . Smoking status: Former Research scientist (life sciences)  . Smokeless tobacco: Never Used  Substance Use Topics  . Alcohol use: Yes    Alcohol/week: 6.0 standard drinks    Types: 6 Cans of beer per week  . Drug use: Yes    Comment: prescribed percocet      Review of Systems Constitutional: No fever/chills Eyes: No visual changes. ENT: No sore throat. Cardiovascular: Denies chest pain. Respiratory: Denies shortness of breath. Gastrointestinal: Lower pelvic pain worse on the left, nausea.  No diarrhea.  No constipation. Genitourinary: Negative for dysuria. Musculoskeletal: Negative for back pain. Skin: Negative for rash. Neurological: Negative for headaches, focal weakness or numbness. All other ROS negative ____________________________________________   PHYSICAL EXAM:  VITAL SIGNS: ED Triage Vitals  Enc Vitals Group     BP 09/15/19 1409 (!) 137/100     Pulse Rate 09/15/19 1409 95     Resp 09/15/19 1409 20     Temp 09/15/19 1411 98.5 F (36.9 C)     Temp  Source 09/15/19 1411 Oral     SpO2 09/15/19 1409 100 %     Weight 09/15/19 1410 130 lb (59 kg)     Height 09/15/19 1410 5\' 5"  (1.651 m)     Head Circumference --      Peak Flow --      Pain Score 09/15/19 1410 9     Pain Loc --      Pain Edu? --      Excl. in GC? --     Constitutional: Alert and oriented. Well appearing and in no acute distress. Eyes: Conjunctivae are normal. EOMI. Head: Atraumatic. Nose: No congestion/rhinnorhea. Mouth/Throat: Mucous membranes are moist.   Neck: No stridor. Trachea Midline. FROM Cardiovascular: Normal rate, regular rhythm. Grossly normal heart sounds.  Good peripheral circulation. Respiratory: Normal  respiratory effort.  No retractions. Lungs CTAB. Gastrointestinal: Lower pelvic pain mostly suprapubic and a little bit left lower quadrant.. No distention. No abdominal bruits.  Musculoskeletal: No lower extremity tenderness nor edema.  No joint effusions. Neurologic:  Normal speech and language. No gross focal neurologic deficits are appreciated.  Skin:  Skin is warm, dry and intact. No rash noted. Psychiatric: Mood and affect are normal. Speech and behavior are normal. GU: Deferred   ____________________________________________   LABS (all labs ordered are listed, but only abnormal results are displayed)  Labs Reviewed  COMPREHENSIVE METABOLIC PANEL - Abnormal; Notable for the following components:      Result Value   Total Bilirubin 2.2 (*)    All other components within normal limits  CBC - Abnormal; Notable for the following components:   MCHC 36.1 (*)    All other components within normal limits  WET PREP, GENITAL  CHLAMYDIA/NGC RT PCR ([facility] ONLY)  LIPASE, BLOOD  URINALYSIS, COMPLETE (UACMP) WITH MICROSCOPIC  POC URINE PREG, ED  POCT PREGNANCY, URINE   ____________________________________________ RADIOLOGY   Official radiology report(s): CT ABDOMEN PELVIS W CONTRAST  Result Date: 09/15/2019 CLINICAL DATA:  Abdominal distension began Friday EXAM: CT ABDOMEN AND PELVIS WITH CONTRAST TECHNIQUE: Multidetector CT imaging of the abdomen and pelvis was performed using the standard protocol following bolus administration of intravenous contrast. CONTRAST:  30mL OMNIPAQUE IOHEXOL 300 MG/ML  SOLN COMPARISON:  None. FINDINGS: Lower chest: No acute abnormality. Hepatobiliary: No focal liver abnormality is seen. Status post cholecystectomy. No biliary dilatation. Pancreas: Unremarkable. No pancreatic ductal dilatation or surrounding inflammatory changes. Spleen: Normal in size without focal abnormality. Adrenals/Urinary Tract: Normal adrenal glands. 16 mm hypodense, fluid  attenuating right renal mass consistent with a cyst. No urolithiasis or obstructive uropathy. Normal bladder. Stomach/Bowel: Stomach is within normal limits. Appendix appears normal. No bowel dilatation. Relative bowel wall thickening of the sigmoid colon which may be secondary to underdistention versus mild colitis. Vascular/Lymphatic: No significant vascular findings are present. No enlarged abdominal or pelvic lymph nodes. Reproductive: Uterus and bilateral adnexa are unremarkable. Other: No abdominal wall hernia or abnormality. No abdominopelvic ascites. Musculoskeletal: No acute osseous abnormality. No aggressive osseous lesion. IMPRESSION: 1. Relative bowel wall thickening of the sigmoid colon which may be secondary to underdistention versus mild colitis. Electronically Signed   By: 72m   On: 09/15/2019 18:14   11/15/2019 PELVIC COMPLETE W TRANSVAGINAL AND TORSION R/O  Result Date: 09/15/2019 CLINICAL DATA:  Left lower quadrant abdominal pain for 3 days EXAM: TRANSABDOMINAL AND TRANSVAGINAL ULTRASOUND OF PELVIS DOPPLER ULTRASOUND OF OVARIES TECHNIQUE: Both transabdominal and transvaginal ultrasound examinations of the pelvis were performed. Transabdominal technique was performed for global imaging of  the pelvis including uterus, ovaries, adnexal regions, and pelvic cul-de-sac. It was necessary to proceed with endovaginal exam following the transabdominal exam to visualize the ovaries. Color and duplex Doppler ultrasound was utilized to evaluate blood flow to the ovaries. COMPARISON:  None. FINDINGS: Uterus Measurements: 8.3 x 3.2 x 3.7 cm = volume: 51 mL. No fibroids or other mass visualized. Endometrium Thickness: 6 mm.  No focal abnormality visualized. Right ovary Measurements: 3.7 x 2.4 x 2.8 cm = volume: 12.7 mL. Normal appearance/no adnexal mass. Left ovary Measurements: 2.7 x 2.2 x 2.9 cm = volume: 9.1 mL. Normal appearance/no adnexal mass. Pulsed Doppler evaluation of both ovaries demonstrates  normal low-resistance arterial and venous waveforms. Other findings No abnormal free fluid. IMPRESSION: Normal appearing uterus and ovaries. Electronically Signed   By: Jonna Clark M.D.   On: 09/15/2019 16:36    ____________________________________________   PROCEDURES  Procedure(s) performed (including Critical Care):  Procedures   ____________________________________________   INITIAL IMPRESSION / ASSESSMENT AND PLAN / ED COURSE  Sara Berger was evaluated in Emergency Department on 09/15/2019 for the symptoms described in the history of present illness. She was evaluated in the context of the global COVID-19 pandemic, which necessitated consideration that the patient might be at risk for infection with the SARS-CoV-2 virus that causes COVID-19. Institutional protocols and algorithms that pertain to the evaluation of patients at risk for COVID-19 are in a state of rapid change based on information released by regulatory bodies including the CDC and federal and state organizations. These policies and algorithms were followed during the patient's care in the ED.    Patient presents with lower pelvic pain in the setting of her menstruation.  Patient has previously been seen by [facility] and told she may have endometriosis.  Also has had prior cyst of her left ovary.  Will get urine to evaluate for ectopic pregnancy, urine to evaluate for UTI.  Patient is low risk for PID but will do pelvic exam.  Will get transvaginal ultrasound to rule out torsion, hemorrhagic cyst.  No evidence of anemia.  Patient's total bili is slightly elevated but it has been previously.  Pregnancy test was negative  Patient ultrasound was negative.  Patient not feeling any better after the medication and still having severe lower abdominal pain.  Patient is tearful.  Discussed with patient need proceed with CT scan to rule out appendicitis.  We have low suspicion for PID.  Pelvic exam was performed patient does  have some discomfort patient is also on her period.  Low risk for PID, no vaginal discharge.  Patient was positive for clue cells  Patient feeling better after the medicine.  CT scan was concerning for mild colitis.  Discussed with patient and she has had some diarrhea recently.  She also has some family history of ulcerative colitis.  Will we discussed trialing some antibiotics and patient need to follow-up with GI.  Unclear if her pain could be from a GI source versus OB source such as from her menstruation versus something like endometrial-itis.  Will give a few pain medications and have patient follow-up with both.  Patient understands that she should take MiraLAX if she develops any constipation.      ____________________________________________   FINAL CLINICAL IMPRESSION(S) / ED DIAGNOSES   Final diagnoses:  Left lower quadrant abdominal pain  Colitis  Bacterial vaginosis      MEDICATIONS GIVEN DURING THIS VISIT:  Medications  ketorolac (TORADOL) 30 MG/ML injection 30 mg (30 mg  Intramuscular Given 09/15/19 1633)  acetaminophen (TYLENOL) tablet 1,000 mg (1,000 mg Oral Given 09/15/19 1634)  ondansetron (ZOFRAN-ODT) disintegrating tablet 4 mg (4 mg Oral Given 09/15/19 1634)  HYDROmorphone (DILAUDID) injection 0.5 mg (0.5 mg Intravenous Given 09/15/19 1746)  ondansetron (ZOFRAN) injection 4 mg (4 mg Intravenous Given 09/15/19 1746)  iohexol (OMNIPAQUE) 300 MG/ML solution 75 mL (75 mLs Intravenous Contrast Given 09/15/19 1751)     ED Discharge Orders         Ordered    metroNIDAZOLE (FLAGYL) 500 MG tablet  2 times daily     09/15/19 1859    ciprofloxacin (CIPRO) 500 MG tablet  2 times daily     09/15/19 1859    ondansetron (ZOFRAN ODT) 4 MG disintegrating tablet  Every 8 hours PRN     09/15/19 1859    oxyCODONE (ROXICODONE) 5 MG immediate release tablet  Every 8 hours PRN     09/15/19 1859           Note:  This document was prepared using Dragon voice recognition software  and may include unintentional dictation errors.   Concha Se, MD 09/15/19 [phone]

## 2019-09-15 NOTE — ED Notes (Signed)
Pt in u/s

## 2019-09-15 NOTE — Discharge Instructions (Addendum)
Labs show slightly elevated total bilirubin but your CT scan showed concerns for possible colitis.  This could be secondary to bowel issue versus secondary to an OB issue such as endometrial-itis.  You should continue taking the Tylenol and ibuprofen use the oxycodone for breakthrough pain only.  Take the course of antibiotics.  Take MiraLAX if you start to get constipated   IMPRESSION:  1. Relative bowel wall thickening of the sigmoid colon which may be  secondary to underdistention versus mild colitis.

## 2019-11-11 ENCOUNTER — Encounter: Payer: Self-pay | Admitting: *Deleted

## 2019-11-11 ENCOUNTER — Ambulatory Visit: Payer: PRIVATE HEALTH INSURANCE | Admitting: Gastroenterology

## 2019-12-10 ENCOUNTER — Telehealth: Payer: Self-pay | Admitting: General Practice

## 2019-12-10 NOTE — Telephone Encounter (Signed)
Individual has been contacted 3+ times regarding the ED referral and has been given information regarding the clinic. No further attempts will be made to contact the individual.

## 2020-08-12 ENCOUNTER — Emergency Department (HOSPITAL_COMMUNITY): Payer: Self-pay

## 2020-08-12 ENCOUNTER — Encounter (HOSPITAL_COMMUNITY): Payer: Self-pay | Admitting: *Deleted

## 2020-08-12 ENCOUNTER — Other Ambulatory Visit: Payer: Self-pay

## 2020-08-12 ENCOUNTER — Emergency Department (HOSPITAL_COMMUNITY)
Admission: EM | Admit: 2020-08-12 | Discharge: 2020-08-12 | Disposition: A | Discharge status: Home / Self Care | Payer: Self-pay | Attending: Emergency Medicine | Admitting: Emergency Medicine

## 2020-08-12 DIAGNOSIS — Z87891 Personal history of nicotine dependence: Secondary | ICD-10-CM | POA: Insufficient documentation

## 2020-08-12 DIAGNOSIS — J101 Influenza due to other identified influenza virus with other respiratory manifestations: Secondary | ICD-10-CM | POA: Insufficient documentation

## 2020-08-12 DIAGNOSIS — Z20822 Contact with and (suspected) exposure to covid-19: Secondary | ICD-10-CM | POA: Insufficient documentation

## 2020-08-12 LAB — POC URINE PREG, ED: Preg Test, Ur: NEGATIVE

## 2020-08-12 LAB — RESP PANEL BY RT-PCR (FLU A&B, COVID) ARPGX2
Influenza A by PCR: POSITIVE — AB
Influenza B by PCR: NEGATIVE
SARS Coronavirus 2 by RT PCR: NEGATIVE

## 2020-08-12 MED ORDER — ACETAMINOPHEN 325 MG PO TABS
650.0000 mg | ORAL_TABLET | Freq: Once | ORAL | Status: AC | PRN
  Administered 2020-08-12: 650 mg via ORAL
  Filled 2020-08-12: qty 2

## 2020-08-12 MED ORDER — OSELTAMIVIR PHOSPHATE 75 MG PO CAPS: 75.0000 mg | ORAL_CAPSULE | Freq: Two times a day (BID) | ORAL | 0 refills | Status: AC

## 2020-08-12 MED ORDER — METOCLOPRAMIDE HCL 10 MG PO TABS
10.0000 mg | ORAL_TABLET | Freq: Once | ORAL | Status: AC
  Administered 2020-08-12: 10 mg via ORAL
  Filled 2020-08-12: qty 1

## 2020-08-12 MED ORDER — OXYCODONE HCL 5 MG PO TABS
5.0000 mg | ORAL_TABLET | Freq: Once | ORAL | Status: AC
  Administered 2020-08-12: 5 mg via ORAL
  Filled 2020-08-12: qty 1

## 2020-08-12 NOTE — ED Provider Notes (Signed)
[facility]   CSN: [id number] Arrival date & time: 08/12/20  1057     History Chief Complaint  Patient presents with  . URI    Sara Berger is a 30 y.o. female with history of ulcerative colitis who presents with concern for 3 days of worsening nasal congestion with opening myalgias, sore throat, sneezing, headaches, and fevers.  Patient is not vaccinated against COVID-19 and is concerned that she may have been exposed.  She is on alternating Tylenol ibuprofen without significantly in her symptoms.  She endorses nonproductive cough but denies shortness of breath or chest pain.  Endorses nausea but denies vomiting or diarrhea.  Denies any urinary symptoms, denies loss of taste or smell. Patient is tearful at time of my initial exam.  I personally reviewed the patient's medical records.  She is history of ulcerative colitis, depression, follows closely with GI.   HPI     Past Medical History:  Diagnosis Date  . Depression   . IBS (irritable bowel syndrome)   . Strep throat     Patient Active Problem List   Diagnosis Date Noted  . Lingual tonsillitis 03/20/2019  . Peptic ulcer 07/03/2018    Past Surgical History:  Procedure Laterality Date  . CHOLECYSTECTOMY    . COLONOSCOPY       OB History   No obstetric history on file.     Family History  Problem Relation Age of Onset  . Breast cancer Maternal Grandmother   . Colon cancer Maternal Grandfather   . Diabetes Mother   . Congestive Heart Failure Mother   . Heart attack Mother        5 heart attacks within 13 months  . Diabetes Maternal Uncle   . Heart attack Maternal Uncle 26    Social History   Tobacco Use  . Smoking status: Former Games developer  . Smokeless tobacco: Never Used  Vaping Use  . Vaping Use: Some days  Substance Use Topics  . Alcohol use: Yes    Alcohol/week: 6.0 standard drinks    Types: 6 Cans of beer per week  . Drug use: Yes    Comment:  prescribed percocet    Home Medications Prior to Admission medications   Medication Sig Start Date End Date Taking? Authorizing Provider  oseltamivir (TAMIFLU) 75 MG capsule Take 1 capsule (75 mg total) by mouth every 12 (twelve) hours for 5 days. 08/12/20 08/17/20 Yes Sponseller, Eugene Gavia, PA-C  lamoTRIgine (LAMICTAL) 25 MG tablet Take 25 mg by mouth 2 (two) times daily.  05/27/18   [provider]  lidocaine (XYLOCAINE) 2 % solution Use as directed 5 mLs in the mouth or throat every 6 (six) hours as needed for mouth pain. 03/18/19   Joni Reining, PA-C  ondansetron (ZOFRAN ODT) 4 MG disintegrating tablet Take 1 tablet (4 mg total) by mouth every 8 (eight) hours as needed for nausea or vomiting. 09/15/19   Concha Se, MD  predniSONE (STERAPRED UNI-PAK 21 TAB) 10 MG (21) TBPK tablet Please take as directed on the packet 03/23/19   Enid Baas, MD  traMADol (ULTRAM) 50 MG tablet Take 1 tablet (50 mg total) by mouth every 6 (six) hours as needed for severe pain. 03/22/19   Enid Baas, MD    Allergies    Penicillins  Review of Systems   Review of Systems  Constitutional: Positive for activity change, appetite change, chills, fatigue and fever.  HENT: Positive for congestion, sneezing  and sore throat. Negative for trouble swallowing and voice change.   Eyes: Negative.  Negative for photophobia and visual disturbance.  Respiratory: Positive for cough. Negative for choking, chest tightness and shortness of breath.   Cardiovascular: Negative for chest pain, palpitations and leg swelling.  Gastrointestinal: Positive for abdominal pain and nausea. Negative for constipation, diarrhea and vomiting.  Genitourinary: Negative.   Musculoskeletal: Positive for myalgias. Negative for arthralgias.  Allergic/Immunologic: Positive for immunocompromised state.       Ulcerative Colitis  Neurological: Positive for headaches. Negative for dizziness, syncope, facial asymmetry, weakness and  light-headedness.    Physical Exam Updated Vital Signs BP (!) 135/92 (BP Location: Right Arm)   Pulse 98   Temp (!) 102.1 F (38.9 C) (Oral)   Resp 18   LMP 07/19/2020   SpO2 97%   Physical Exam Vitals and nursing note reviewed.  Constitutional:      General: She is not in acute distress.    Appearance: She is normal weight. She is not toxic-appearing.  HENT:     Head: Normocephalic and atraumatic.     Nose: Rhinorrhea present. Rhinorrhea is clear.     Mouth/Throat:     Mouth: Mucous membranes are moist.     Tongue: No lesions.     Pharynx: Oropharynx is clear. Uvula midline. Posterior oropharyngeal erythema present. No oropharyngeal exudate or uvula swelling.     Tonsils: No tonsillar exudate or tonsillar abscesses.  Eyes:     General: Lids are normal. Vision grossly intact.        Right eye: No discharge.        Left eye: No discharge.     Extraocular Movements: Extraocular movements intact.     Conjunctiva/sclera: Conjunctivae normal.     Pupils: Pupils are equal, round, and reactive to light.  Neck:     Trachea: Trachea and phonation normal.  Cardiovascular:     Rate and Rhythm: Normal rate and regular rhythm.     Pulses: Normal pulses.          Radial pulses are 2+ on the right side and 2+ on the left side.       Posterior tibial pulses are 2+ on the right side and 2+ on the left side.     Heart sounds: Normal heart sounds. No murmur heard.   Pulmonary:     Effort: Pulmonary effort is normal. No tachypnea, accessory muscle usage, prolonged expiration or respiratory distress.     Breath sounds: Examination of the left-lower field reveals rales. Rales present. No wheezing.  Chest:     Chest wall: No deformity, swelling, tenderness, crepitus or edema.  Abdominal:     General: Bowel sounds are normal. There is no distension.     Palpations: Abdomen is soft.     Tenderness: There is no abdominal tenderness. There is no right CVA tenderness, left CVA tenderness,  guarding or rebound.  Musculoskeletal:        General: No deformity.     Cervical back: Normal range of motion and neck supple. No tenderness or crepitus. No pain with movement, spinous process tenderness or muscular tenderness.     Right lower leg: No edema.     Left lower leg: No edema.  Lymphadenopathy:     Cervical: No cervical adenopathy.  Skin:    General: Skin is warm and dry.     Capillary Refill: Capillary refill takes less than 2 seconds.  Neurological:     General: No focal  deficit present.     Mental Status: She is alert and oriented to person, place, and time. Mental status is at baseline.  Psychiatric:        Mood and Affect: Mood normal. Affect is tearful.     ED Results / Procedures / Treatments   Labs (all labs ordered are listed, but only abnormal results are displayed) Labs Reviewed  RESP PANEL BY RT-PCR (FLU A&B, COVID) ARPGX2 - Abnormal; Notable for the following components:      Result Value   Influenza A by PCR POSITIVE (*)    All other components within normal limits  POC URINE PREG, ED    EKG None  Radiology DG Chest Portable 1 View  Result Date: 08/12/2020 CLINICAL DATA:  Pharyngitis, cough, sneezing, nasal congestion, headache and myalgias. EXAM: PORTABLE CHEST 1 VIEW COMPARISON:  None. FINDINGS: The heart size and mediastinal contours are within normal limits. Both lungs are clear. The visualized skeletal structures are unremarkable. IMPRESSION: Normal examination. Electronically Signed   By: Beckie Salts M.D.   On: 08/12/2020 12:50    Procedures Procedures   Medications Ordered in ED Medications  acetaminophen (TYLENOL) tablet 650 mg (650 mg Oral Given 08/12/20 1112)  metoCLOPramide (REGLAN) tablet 10 mg (10 mg Oral Given 08/12/20 1217)  oxyCODONE (Oxy IR/ROXICODONE) immediate release tablet 5 mg (5 mg Oral Given 08/12/20 1217)    ED Course  I have reviewed the triage vital signs and the nursing notes.  Pertinent labs & imaging results that  were available during my care of the patient were reviewed by me and considered in my medical decision making (see chart for details).    MDM Rules/Calculators/A&P                         30 year old female who presents with concern for 3 days of viral syndrome symptoms.  She is not vaccinated for COVID-19.  Patient is febrile on intake to 102.9 F, administer Tylenol in triage.  Mildly hypertensive, but vital signs otherwise normal.  Cardiopulmonary exam significantly for rales in the left lung base, abdominal exam is benign.  HEENT exam revealed clear rhinorrhea from nostrils bilaterally as well as posterior pharyngeal erythema without exudate or sign of oropharyngeal abscess.  Patient is neurologically intact.    Chest x-ray negative, very reassuring.  Analgesia offered. Given patient's immunocompromised status changes, recommend molnupiravir if COVID+; will await discharge until patient's COVID-19 test has returned.  COVID-19 test negative, however patient is positive for influenza A.  Will discharge with prescription for Tamiflu; symptoms x48 hours.  Sara Berger voiced understanding of her medical evaluation and treatment plan.  Each of her questions was  answered to her expressed satisfaction.  Return precautions given.  Patient is stable and appropriate for discharge at this time.  This chart was dictated using voice recognition software, Dragon. Despite the best efforts of this provider to proofread and correct errors, errors may still occur which can change documentation meaning.  Final Clinical Impression(s) / ED Diagnoses Final diagnoses:  Influenza A    Rx / DC Orders ED Discharge Orders         Ordered    oseltamivir (TAMIFLU) 75 MG capsule  Every 12 hours        08/12/20 1347           Sponseller, Eugene Gavia, PA-C 08/12/20 1352    Cathren Laine, MD 08/13/20 1314

## 2020-08-12 NOTE — Discharge Instructions (Signed)
You were seen in the emergency department today for your cold-like symptoms and body aches.  Your physical exam and vital signs are very reassuring.  Your chest x-ray did not reveal any infection in your lungs.  You tested negative for COVID-19 today, but unfortunately did test positive for influenza A.  For this reason you have been prescribed Tamiflu; please be advised that this will not make you feel better, but can shorten the course of your influenza infection.  Return to the emergency department if you develop any chest pain, difficulty breathing, nausea or vomiting that does nonstop, or any other new severe symptoms.  Would recommend that you follow-up with your gastroenterologist and discuss benefits of COVID-19 vaccination.

## 2020-08-12 NOTE — ED Notes (Signed)
O2 @ 97 while ambulating

## 2020-08-12 NOTE — ED Notes (Signed)
Pt had a POC URINE PREGNANCY RESULT (-) NEG

## 2020-08-12 NOTE — ED Triage Notes (Signed)
Pt complains of sore throat, cough, sneezing, nasal congestion, headache, body aches. She is concerned she has COVID. Has not been vaccinated for COVID or flu. She has been alternating tylenol and ibuprofen. Last took tylenol last night.

## 2020-08-17 ENCOUNTER — Encounter (HOSPITAL_COMMUNITY): Payer: Self-pay

## 2020-08-17 ENCOUNTER — Other Ambulatory Visit: Payer: Self-pay

## 2020-08-17 ENCOUNTER — Emergency Department (HOSPITAL_COMMUNITY)
Admission: EM | Admit: 2020-08-17 | Discharge: 2020-08-17 | Disposition: A | Discharge status: Home / Self Care | Payer: Self-pay | Attending: Emergency Medicine | Admitting: Emergency Medicine

## 2020-08-17 ENCOUNTER — Emergency Department (HOSPITAL_COMMUNITY): Payer: Self-pay

## 2020-08-17 DIAGNOSIS — J0100 Acute maxillary sinusitis, unspecified: Secondary | ICD-10-CM

## 2020-08-17 DIAGNOSIS — H538 Other visual disturbances: Secondary | ICD-10-CM | POA: Insufficient documentation

## 2020-08-17 DIAGNOSIS — H539 Unspecified visual disturbance: Secondary | ICD-10-CM

## 2020-08-17 DIAGNOSIS — H60392 Other infective otitis externa, left ear: Secondary | ICD-10-CM | POA: Insufficient documentation

## 2020-08-17 DIAGNOSIS — Z87891 Personal history of nicotine dependence: Secondary | ICD-10-CM | POA: Insufficient documentation

## 2020-08-17 LAB — URINALYSIS, ROUTINE W REFLEX MICROSCOPIC
Glucose, UA: NEGATIVE mg/dL
Hgb urine dipstick: NEGATIVE
Ketones, ur: 5 mg/dL — AB
Leukocytes,Ua: NEGATIVE
Nitrite: NEGATIVE
Protein, ur: 100 mg/dL — AB
Specific Gravity, Urine: 1.033 — ABNORMAL HIGH (ref 1.005–1.030)
pH: 5 (ref 5.0–8.0)

## 2020-08-17 LAB — CBC
HCT: 41.9 % (ref 36.0–46.0)
Hemoglobin: 14.2 g/dL (ref 12.0–15.0)
MCH: 31.4 pg (ref 26.0–34.0)
MCHC: 33.9 g/dL (ref 30.0–36.0)
MCV: 92.7 fL (ref 80.0–100.0)
Platelets: 303 10*3/uL (ref 150–400)
RBC: 4.52 MIL/uL (ref 3.87–5.11)
RDW: 13.2 % (ref 11.5–15.5)
WBC: 9.5 10*3/uL (ref 4.0–10.5)
nRBC: 0 % (ref 0.0–0.2)

## 2020-08-17 LAB — COMPREHENSIVE METABOLIC PANEL
ALT: 84 U/L — ABNORMAL HIGH (ref 0–44)
AST: 82 U/L — ABNORMAL HIGH (ref 15–41)
Albumin: 3.9 g/dL (ref 3.5–5.0)
Alkaline Phosphatase: 145 U/L — ABNORMAL HIGH (ref 38–126)
Anion gap: 12 (ref 5–15)
BUN: 7 mg/dL (ref 6–20)
CO2: 26 mmol/L (ref 22–32)
Calcium: 9.2 mg/dL (ref 8.9–10.3)
Chloride: 98 mmol/L (ref 98–111)
Creatinine, Ser: 0.64 mg/dL (ref 0.44–1.00)
GFR, Estimated: >60 mL/min (ref >60)
Glucose, Bld: 92 mg/dL (ref 70–99)
Potassium: 3.7 mmol/L (ref 3.5–5.1)
Sodium: 136 mmol/L (ref 135–145)
Total Bilirubin: 0.9 mg/dL (ref 0.3–1.2)
Total Protein: 7.7 g/dL (ref 6.5–8.1)

## 2020-08-17 LAB — LIPASE, BLOOD: Lipase: 33 U/L (ref 11–51)

## 2020-08-17 LAB — I-STAT BETA HCG BLOOD, ED (MC, WL, AP ONLY): I-stat hCG, quantitative: <5 m[IU]/mL (ref <5)

## 2020-08-17 MED ORDER — PSEUDOEPHEDRINE HCL 60 MG PO TABS
30.0000 mg | ORAL_TABLET | Freq: Once | ORAL | Status: AC
  Administered 2020-08-17: 30 mg via ORAL
  Filled 2020-08-17: qty 1

## 2020-08-17 MED ORDER — PSEUDOEPHEDRINE HCL ER 120 MG PO TB12: 120.0000 mg | ORAL_TABLET | Freq: Two times a day (BID) | ORAL | 0 refills | Status: DC

## 2020-08-17 MED ORDER — CETIRIZINE HCL 10 MG PO TABS: 10.0000 mg | ORAL_TABLET | Freq: Every day | ORAL | 0 refills | Status: DC

## 2020-08-17 MED ORDER — SULFAMETHOXAZOLE-TRIMETHOPRIM 800-160 MG PO TABS: ORAL_TABLET | ORAL | 0 refills | Status: AC

## 2020-08-17 MED ORDER — SULFAMETHOXAZOLE-TRIMETHOPRIM 800-160 MG PO TABS: ORAL_TABLET | ORAL | 0 refills | Status: DC

## 2020-08-17 MED ORDER — PSEUDOEPHEDRINE HCL ER 120 MG PO TB12: 120.0000 mg | ORAL_TABLET | Freq: Two times a day (BID) | ORAL | 0 refills | Status: AC

## 2020-08-17 MED ORDER — CIPROFLOXACIN-DEXAMETHASONE 0.3-0.1 % OT SUSP
4.0000 [drp] | Freq: Two times a day (BID) | OTIC | Status: DC
  Administered 2020-08-17: 4 [drp] via OTIC
  Filled 2020-08-17: qty 7.5

## 2020-08-17 MED ORDER — CETIRIZINE HCL 10 MG PO TABS: 10.0000 mg | ORAL_TABLET | Freq: Every day | ORAL | 0 refills | Status: AC

## 2020-08-17 NOTE — Discharge Instructions (Addendum)
As discussed, your evaluation today has been largely reassuring.  But, it is important that you monitor your condition carefully, and do not hesitate to return to the ED if you develop new, or concerning changes in your condition.  Please use the provided eardrops twice daily, 4 drops, for the next 5 days. Please use Sudafed, as prescribed for the next 5 days as well. Please use Zyrtec, for the next 5 days. Otherwise, please follow-up with your physician for appropriate ongoing care.

## 2020-08-17 NOTE — ED Triage Notes (Addendum)
Patient reports that she was diagnosed with the flu last week. Patient continues to have left ear pain, chest congestion, emesis, abdominal pain, and a fever. Patient states colors are distorted when she looks at things.

## 2020-08-17 NOTE — ED Notes (Signed)
An After Visit Summary was printed and given to the patient. Discharge instructions given and no further questions at this time.  

## 2020-08-17 NOTE — ED Provider Notes (Signed)
[facility]-EMERGENCY DEPT Provider Note   CSN: [id number] Arrival date & time: 08/17/20  1532     History Chief Complaint  Patient presents with  . Cough  . Emesis  . vision issues  . Otalgia    Sara Berger is a 30 y.o. female.  HPI Previously well young female with diagnosis of influenza last week now presents with concern of ongoing ear discomfort, and visual disturbance. Patient does have a history of IBS, but notes that this has not been an issue recently. She is not currently on steroids. Since being evaluated and seen 1 week ago she has been taking her Tamiflu.  She notes that over the past 2 or 3 days she has felt new, worsening pain in her left ear, muffled sensation and hearing, and newly over the past day or so vision disturbance. She describes the pain as severe, focal, inferior to the ear.  The visual disturbances are described as change in the color pattern from a normal.  No loss of vision.  No weakness in any extremity, no ongoing fever, no other ongoing pain.  No relief in spite of taking her prescribed medication and OTC tablets as well.    Past Medical History:  Diagnosis Date  . Depression   . IBS (irritable bowel syndrome)   . Strep throat     Patient Active Problem List   Diagnosis Date Noted  . Lingual tonsillitis 03/20/2019  . Peptic ulcer 07/03/2018    Past Surgical History:  Procedure Laterality Date  . CHOLECYSTECTOMY    . COLONOSCOPY       OB History   No obstetric history on file.     Family History  Problem Relation Age of Onset  . Breast cancer Maternal Grandmother   . Colon cancer Maternal Grandfather   . Diabetes Mother   . Congestive Heart Failure Mother   . Heart attack Mother        5 heart attacks within 13 months  . Diabetes Maternal Uncle   . Heart attack Maternal Uncle 54    Social History   Tobacco Use  . Smoking status: Former Games developer  . Smokeless tobacco: Never Used  Vaping Use  .  Vaping Use: Some days  Substance Use Topics  . Alcohol use: Yes    Alcohol/week: 6.0 standard drinks    Types: 6 Cans of beer per week    Comment: occasionally  . Drug use: Yes    Comment: delta 8-vapes    Home Medications Prior to Admission medications   Medication Sig Start Date End Date Taking? Authorizing Provider  lamoTRIgine (LAMICTAL) 25 MG tablet Take 25 mg by mouth 2 (two) times daily.  05/27/18   [provider]  lidocaine (XYLOCAINE) 2 % solution Use as directed 5 mLs in the mouth or throat every 6 (six) hours as needed for mouth pain. 03/18/19   Joni Reining, PA-C  ondansetron (ZOFRAN ODT) 4 MG disintegrating tablet Take 1 tablet (4 mg total) by mouth every 8 (eight) hours as needed for nausea or vomiting. 09/15/19   Concha Se, MD  oseltamivir (TAMIFLU) 75 MG capsule Take 1 capsule (75 mg total) by mouth every 12 (twelve) hours for 5 days. 08/12/20 08/17/20  Sponseller, Eugene Gavia, PA-C  predniSONE (STERAPRED UNI-PAK 21 TAB) 10 MG (21) TBPK tablet Please take as directed on the packet 03/23/19   Enid Baas, MD  traMADol (ULTRAM) 50 MG tablet Take 1 tablet (50 mg total) by  mouth every 6 (six) hours as needed for severe pain. 03/22/19   Enid Baas, MD    Allergies    Penicillins  Review of Systems   Review of Systems  Constitutional:       Per HPI, otherwise negative  HENT:       Per HPI, otherwise negative  Eyes: Positive for visual disturbance. Negative for photophobia and pain.  Respiratory:       Per HPI, otherwise negative  Cardiovascular:       Per HPI, otherwise negative  Gastrointestinal: Negative for vomiting.  Endocrine:       Negative aside from HPI  Genitourinary:       Neg aside from HPI   Musculoskeletal:       Per HPI, otherwise negative  Skin: Negative.   Neurological: Negative for syncope.    Physical Exam Updated Vital Signs BP (!) 143/101 (BP Location: Left Arm)   Pulse 95   Temp 98.3 F (36.8 C) (Oral)   Resp 18    Ht 5\' 5"  (1.651 m)   Wt 47.6 kg   LMP 07/19/2020   SpO2 98%   BMI 17.47 kg/m   Physical Exam Vitals and nursing note reviewed.  Constitutional:      General: She is not in acute distress.    Appearance: She is well-developed and well-nourished.  HENT:     Head: Normocephalic and atraumatic.     Ears:   Eyes:     Extraocular Movements: EOM normal.     Conjunctiva/sclera: Conjunctivae normal.  Cardiovascular:     Rate and Rhythm: Normal rate and regular rhythm.  Pulmonary:     Effort: Pulmonary effort is normal. No respiratory distress.     Breath sounds: Normal breath sounds. No stridor.  Abdominal:     General: There is no distension.  Musculoskeletal:        General: No edema.  Skin:    General: Skin is warm and dry.  Neurological:     Mental Status: She is alert and oriented to person, place, and time.     Cranial Nerves: No cranial nerve deficit or dysarthria.     Motor: No weakness.     Coordination: Coordination normal.  Psychiatric:        Mood and Affect: Mood and affect normal.     ED Results / Procedures / Treatments   Labs (all labs ordered are listed, but only abnormal results are displayed) Labs Reviewed  CBC  LIPASE, BLOOD  COMPREHENSIVE METABOLIC PANEL  URINALYSIS, ROUTINE W REFLEX MICROSCOPIC  I-STAT BETA HCG BLOOD, ED ([facility], [facility], [facility] ONLY)    EKG None  Radiology No results found.  Procedures Procedures   Medications Ordered in ED Medications  ciprofloxacin-dexamethasone (CIPRODEX) 0.3-0.1 % OTIC (EAR) suspension 4 drop (has no administration in time range)  pseudoephedrine (SUDAFED) tablet 30 mg (has no administration in time range)    ED Course  I have reviewed the triage vital signs and the nursing notes.  Pertinent labs & imaging results that were available during my care of the patient were reviewed by me and considered in my medical decision making (see chart for details).  Young adult female, previously well presents with ear  pain, muffled hearing, and new vision change. She is otherwise neurologically intact, is awake, alert, hemodynamically unremarkable. Given the patient's illness, CT scan ordered.  With evidence for external auditory canal erythema suggesting infection patient start topical antibiotics.  Patient is otherwise hemodynamically unremarkable, and if  head CT is unremarkable she may be appropriate for discharge with outpatient ophthalmology and primary care follow-up.  Dr. Estell Harpin is aware of the patient. Final Clinical Impression(s) / ED Diagnoses Final diagnoses:  Other infective acute otitis externa of left ear  Vision changes    Rx / DC Orders ED Discharge Orders         Ordered    pseudoephedrine (SUDAFED 12 HOUR) 120 MG 12 hr tablet  2 times daily        08/17/20 1656    cetirizine (ZYRTEC ALLERGY) 10 MG tablet  Daily        08/17/20 1656           Gerhard Munch, MD 08/17/20 1711

## 2021-02-02 ENCOUNTER — Other Ambulatory Visit: Payer: Self-pay | Admitting: Physician Assistant

## 2021-02-02 ENCOUNTER — Ambulatory Visit
Admission: RE | Admit: 2021-02-02 | Discharge: 2021-02-02 | Disposition: A | Discharge status: Home / Self Care | Payer: Self-pay | Attending: Physician Assistant | Admitting: Physician Assistant

## 2021-02-02 ENCOUNTER — Ambulatory Visit
Admission: RE | Admit: 2021-02-02 | Discharge: 2021-02-02 | Disposition: A | Discharge status: Home / Self Care | Payer: Self-pay | Source: Ambulatory Visit | Attending: Physician Assistant | Admitting: Physician Assistant

## 2021-02-02 DIAGNOSIS — M542 Cervicalgia: Secondary | ICD-10-CM

## 2021-11-01 ENCOUNTER — Other Ambulatory Visit: Payer: Self-pay

## 2021-11-01 ENCOUNTER — Emergency Department
Admission: EM | Admit: 2021-11-01 | Discharge: 2021-11-01 | Disposition: A | Discharge status: Home / Self Care | Payer: Self-pay

## 2021-11-01 ENCOUNTER — Encounter (HOSPITAL_COMMUNITY): Payer: Self-pay

## 2021-11-01 DIAGNOSIS — Z3200 Encounter for pregnancy test, result unknown: Secondary | ICD-10-CM | POA: Insufficient documentation

## 2021-11-01 DIAGNOSIS — G43909 Migraine, unspecified, not intractable, without status migrainosus: Secondary | ICD-10-CM | POA: Insufficient documentation

## 2021-11-01 DIAGNOSIS — R519 Headache, unspecified: Secondary | ICD-10-CM

## 2021-11-01 LAB — CBC WITH DIFF
BASOPHIL #: <0.1 10*3/uL (ref <=0.20)
BASOPHIL %: 0 %
EOSINOPHIL #: <0.1 10*3/uL (ref <=0.50)
EOSINOPHIL %: 1 %
HCT: 35.6 % (ref 34.8–46.0)
HGB: 11.9 g/dL (ref 11.5–16.0)
IMMATURE GRANULOCYTE #: <0.1 10*3/uL (ref <0.10)
IMMATURE GRANULOCYTE %: 0 % (ref 0–1)
LYMPHOCYTE #: 1.03 10*3/uL (ref 1.00–4.80)
LYMPHOCYTE %: 19 %
MCH: 32.1 pg — ABNORMAL HIGH (ref 26.0–32.0)
MCHC: 33.4 g/dL (ref 31.0–35.5)
MCV: 96 fL (ref 78.0–100.0)
MONOCYTE #: 0.59 10*3/uL (ref 0.20–1.10)
MONOCYTE %: 11 %
MPV: 9.9 fL (ref 8.7–12.5)
NEUTROPHIL #: 3.59 10*3/uL (ref 1.50–7.70)
NEUTROPHIL %: 69 %
PLATELETS: 245 10*3/uL (ref 150–400)
RBC: 3.71 10*6/uL — ABNORMAL LOW (ref 3.85–5.22)
RDW-CV: 12.3 % (ref 11.5–15.5)
WBC: 5.3 10*3/uL (ref 3.7–11.0)

## 2021-11-01 LAB — LIGHT GREEN TOP TUBE

## 2021-11-01 LAB — COMPREHENSIVE METABOLIC PANEL, NON-FASTING
ALBUMIN: 3.6 g/dL (ref 3.5–5.0)
ALKALINE PHOSPHATASE: 62 U/L (ref 40–110)
ALT (SGPT): 23 U/L — ABNORMAL HIGH (ref 8–22)
ANION GAP: 6 mmol/L (ref 4–13)
AST (SGOT): 25 U/L (ref 8–45)
BILIRUBIN TOTAL: 1.1 mg/dL (ref 0.3–1.3)
BUN/CREA RATIO: 9 (ref 6–22)
BUN: 6 mg/dL — ABNORMAL LOW (ref 8–25)
CALCIUM: 9.1 mg/dL (ref 8.5–10.0)
CHLORIDE: 109 mmol/L (ref 96–111)
CO2 TOTAL: 26 mmol/L (ref 22–30)
CREATININE: 0.69 mg/dL (ref 0.60–1.05)
ESTIMATED GFR: >90 mL/min/BSA (ref >=60)
GLUCOSE: 83 mg/dL (ref 65–125)
POTASSIUM: 3.9 mmol/L (ref 3.5–5.1)
PROTEIN TOTAL: 5.9 g/dL — ABNORMAL LOW (ref 6.0–7.9)
SODIUM: 141 mmol/L (ref 136–145)

## 2021-11-01 LAB — LACTIC ACID LEVEL W/ REFLEX FOR LEVEL >2.0: LACTIC ACID: 1.8 mmol/L (ref 0.5–2.2)

## 2021-11-01 LAB — HCG, SERUM QUALITATIVE, PREGNANCY: PREGNANCY, SERUM QUALITATIVE: NEGATIVE

## 2021-11-01 LAB — BLUE TOP TUBE

## 2021-11-01 MED ORDER — DEXAMETHASONE 4 MG TABLET
4.00 mg | ORAL_TABLET | ORAL | Status: AC
Start: 2021-11-01 — End: 2021-11-01
  Administered 2021-11-01: 4 mg via ORAL
  Filled 2021-11-01: qty 1

## 2021-11-01 MED ORDER — KETOROLAC 10 MG TABLET
10.0000 mg | ORAL_TABLET | Freq: Four times a day (QID) | ORAL | 0 refills | Status: AC | PRN
Start: 2021-11-01 — End: ?

## 2021-11-01 MED ORDER — HYDROCODONE 5 MG-ACETAMINOPHEN 325 MG TABLET
1.0000 | ORAL_TABLET | ORAL | Status: AC
Start: 2021-11-01 — End: 2021-11-01
  Administered 2021-11-01: 1 via ORAL
  Filled 2021-11-01: qty 1

## 2021-11-01 MED ORDER — KETOROLAC 10 MG TABLET
10.00 mg | ORAL_TABLET | ORAL | Status: AC
Start: 2021-11-01 — End: 2021-11-01
  Administered 2021-11-01: 10 mg via ORAL
  Filled 2021-11-01: qty 1

## 2021-11-01 MED ORDER — CYCLOBENZAPRINE 10 MG TABLET
10.00 mg | ORAL_TABLET | ORAL | Status: AC
Start: 2021-11-01 — End: 2021-11-01
  Administered 2021-11-01: 10 mg via ORAL
  Filled 2021-11-01: qty 1

## 2021-11-01 NOTE — ED Triage Notes (Signed)
Pt's headache started on Thursday, yesterday pt's headache is worse along with neck and shoulder pain. Pt is also getting dizzy.

## 2021-11-01 NOTE — ED Provider Notes (Signed)
[facility]  ED Primary Provider Note  History of Present Illness   Chief Complaint   Patient presents with   . Headache   . Neck Pain     Deanna Mcgee is a 31 y.o. female who had concerns including Headache and Neck Pain.  Arrival: The patient arrived by Car    This is a 31 year old female who presents emergency department with complaints of headache for the last several days.  Patient reports taking Tylenol and ibuprofen over-the-counter with minimal relief of symptoms.  Patient does report that she has a history of migraines, and this feels similar but also different.  She also does report that she has some neck pain and some photophobia.  They deny any fever, chills, weakness, numbness, chest pain, palpitations, shortness of breath, dyspnea, wheezing, trouble or pain with urination, abdominal pain, nausea, vomiting, diarrhea, or any other acute complaint at this time.         History provided by:  Patient    Review of Systems   Pertinent positive and negative ROS as per HPI.  Historical Data   History Reviewed This Encounter: Medical History  Surgical History  Family History  Social History      Physical Exam   ED Triage Vitals [11/01/21 1515]   BP (Non-Invasive) 125/79   Heart Rate 70   Respiratory Rate 18   Temperature 36.9 C (98.4 F)   SpO2 100 %   Weight 52.2 kg (115 lb)   Height 1.651 m (5\' 5" )     Physical Exam  Vitals and nursing note reviewed.   Constitutional:       General: She is not in acute distress.     Appearance: She is well-developed.   HENT:      Head: Normocephalic and atraumatic.      Nose: No congestion or rhinorrhea.      Mouth/Throat:      Mouth: Mucous membranes are moist.   Eyes:      Conjunctiva/sclera: Conjunctivae normal.   Cardiovascular:      Rate and Rhythm: Normal rate and regular rhythm.      Heart sounds: No murmur heard.  Pulmonary:      Effort: Pulmonary effort is normal. No respiratory distress.      Breath sounds: Normal breath sounds.    Abdominal:      Palpations: Abdomen is soft.      Tenderness: There is no abdominal tenderness.   Musculoskeletal:         General: No swelling.      Cervical back: Normal range of motion and neck supple. Tenderness present.   Lymphadenopathy:      Cervical: No cervical adenopathy.   Skin:     General: Skin is warm and dry.      Capillary Refill: Capillary refill takes less than 2 seconds.   Neurological:      General: No focal deficit present.      Mental Status: She is alert and oriented to person, place, and time.      GCS: GCS eye subscore is 4. GCS verbal subscore is 5. GCS motor subscore is 6.      Cranial Nerves: Cranial nerves 2-12 are intact.      Sensory: Sensation is intact.      Motor: Motor function is intact.      Coordination: Coordination is intact.      Gait: Gait is intact.      Deep Tendon Reflexes:  Reflexes are normal and symmetric.      Comments: Negative Kernig and Brudzinski signs   Psychiatric:         Mood and Affect: Mood normal.       Patient Data     Labs Ordered/Reviewed   COMPREHENSIVE METABOLIC PANEL, NON-FASTING - Abnormal; Notable for the following components:       Result Value    BUN 6 (*)     ALT (SGPT) 23 (*)     PROTEIN TOTAL 5.9 (*)     All other components within normal limits   CBC WITH DIFF - Abnormal; Notable for the following components:    RBC 3.71 (*)     MCH 32.1 (*)     All other components within normal limits   LACTIC ACID LEVEL W/ REFLEX FOR LEVEL >2.0 - Normal   HCG, SERUM QUALITATIVE, PREGNANCY - Normal   CBC/DIFF    Narrative:     The following orders were created for panel order CBC/DIFF.  Procedure                               Abnormality         Status                     ---------                               -----------         ------                     CBC WITH DIFF[[id number]]                Abnormal            Final result                 Please view results for these tests on the individual orders.   URINALYSIS WITH REFLEX MICROSCOPIC AND CULTURE IF  POSITIVE    Narrative:     The following orders were created for panel order URINALYSIS WITH REFLEX MICROSCOPIC AND CULTURE IF POSITIVE.  Procedure                               Abnormality         Status                     ---------                               -----------         ------                     URINALYSIS, MACRO/MICRO[[id number]]                                                       Please view results for these tests on the individual orders.   URINALYSIS, MACRO/MICRO   LIGHT GREEN TOP TUBE     No orders to display  Medical Decision Making        Medical Decision Making  Patient is a 31 y.o. female who was seen and examined in the emergency department 11/01/2021.  Patient presents as per HPI.  Physical exam with pertinent abnormalities as above. Appropriate labs and imaging ordered.  Pertinent medical records reviewed as necessary for today's visit.  During the patient's stay in the emergency department, the above-listed imaging and, or labs are performed to assist with medical decision making and reviewed by myself and attending when available for review. Medications administered as above. Laboratory studies revealed no acute process. Vital signs remained stable through out ED course. Patient's laboratory workup did not indicate active infection and in correlation with negative physical exam findings, there is a low suspicion for meningitis. Also patient does not have acute neuro deficits and is not in severe pain, so SAH or other intracranial process is unlikely. Patient discharged home with family.  AVS reviewed with patient/care giver.  A written copy of the AVS and discharge instructions was given to the patient/care giver.  Questions sufficiently answered as needed.  Patient/care giver encouraged to follow up with PCP as indicated.  In the event of an emergency, patient/care giver instructed to call 911 or go to the nearest emergency room.     Headache disorder: self-limited or minor  problem  Migraine: self-limited or minor problem  Amount and/or Complexity of Data Reviewed  Labs: ordered.  ECG/medicine tests: independent interpretation performed.      Risk  Prescription drug management.        ED Course as of 11/01/21 1802   Tue Nov 01, 2021   1625 LACTIC ACID LEVEL W/ REFLEX FOR LEVEL >2.0  Unremarkable   1625 CBC/DIFF(!)  Unremarkable   1625 HCG, SERUM QUALITATIVE, PREGNANCY  Unremarkable   1625 COMPREHENSIVE METABOLIC PANEL, NON-FASTING(!)  Unremarkable         Medications Administered in the ED   cyclobenzaprine (FLEXERIL) tablet (10 mg Oral Given 11/01/21 1534)   ketorolac (TORADOL) tablet (10 mg Oral Given 11/01/21 1533)   dexAMETHasone (DECADRON) tablet (4 mg Oral Given 11/01/21 1533)   HYDROcodone-acetaminophen (NORCO) 5-325 mg per tablet (1 Tablet Oral Given 11/01/21 1654)     Clinical Impression   Migraine (Primary)   Headache disorder       Disposition: Discharged      Attestation:     The co-signing Dr was present in the ED and available for consultation and did participate in the care of the patient.     The patient was given the opportunity to ask questions and those questions were answered to the patient's satisfaction. The patient was encouraged to call with any additional questions or concerns. Patient was provided work/school excuse as needed. Discussed with patient effects and side effects of medications. Medication safety was discussed. The patient verbalized understanding of treatment plan.    This note was partly generated using a voice recognition software; please excuse any grammatical errors.     Melbourne Abtsody Asuncion Tapscott, FNP-BC

## 2021-11-01 NOTE — Discharge Instructions (Addendum)
Please take tylenol and/or motrin as needed for mild pain/discomfort or fever.    Increase fluid intake.    Please take prescriptions as directed.     Please follow up with your PCP or pediatrician.    Call 911 or go to your nearest emergency department for symptoms of sudden shortness of breath, chest pain, difficulty breathing, coughing up blood, sudden loss of consciousness, or changes in mentation.

## 2022-05-12 DIAGNOSIS — Z419 Encounter for procedure for purposes other than remedying health state, unspecified: Secondary | ICD-10-CM | POA: Diagnosis not present

## 2022-05-26 IMAGING — CT CT HEAD W/O CM
3 series · 14 of 46 positions shown, 16 images · non-contrast
Comparison: CT neck 03/20/2019

CLINICAL DATA: Mental status change, unknown cause L head pain,
vision changes

EXAM:
CT HEAD WITHOUT CONTRAST
TECHNIQUE: Contiguous axial images were obtained from the base of the skull
through the vertex without intravenous contrast.

[Series 2: head wo · axial · 0.47mm/px · z∈[-46,+74]mm · 8 of 29 slices shown, 10 images]
[im 3/29  brain]
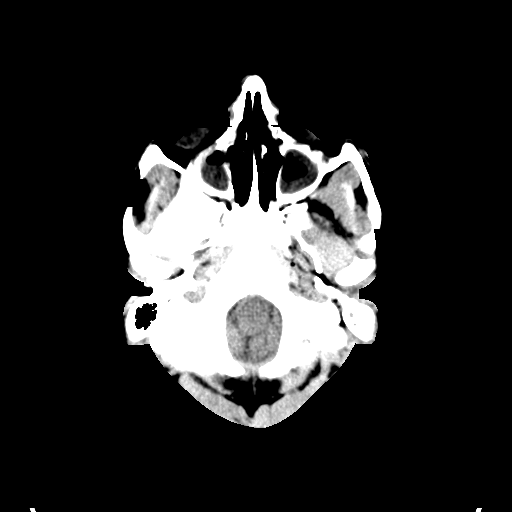
[im 3/29  bone]
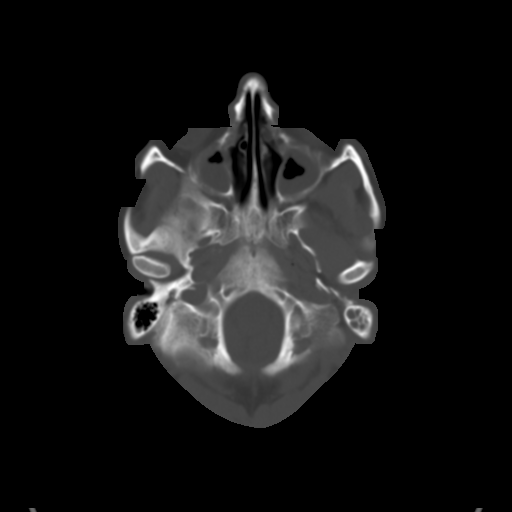
[im 7/29  brain]
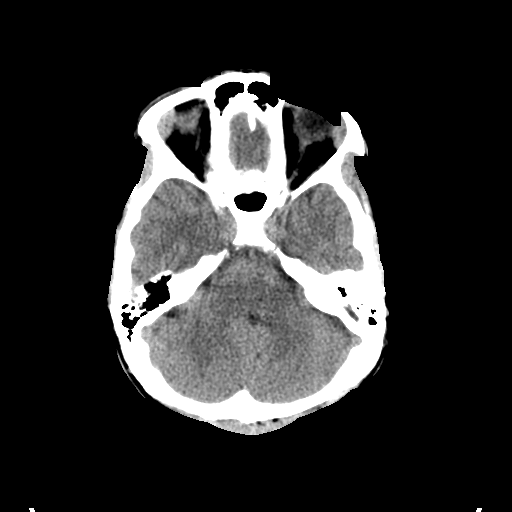
[im 10/29  brain]
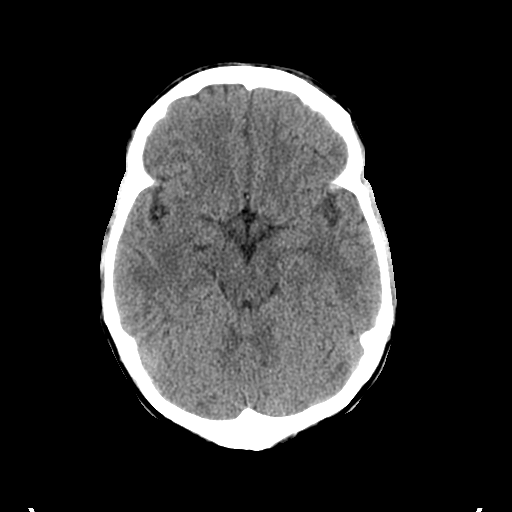
[im 13/29  brain]
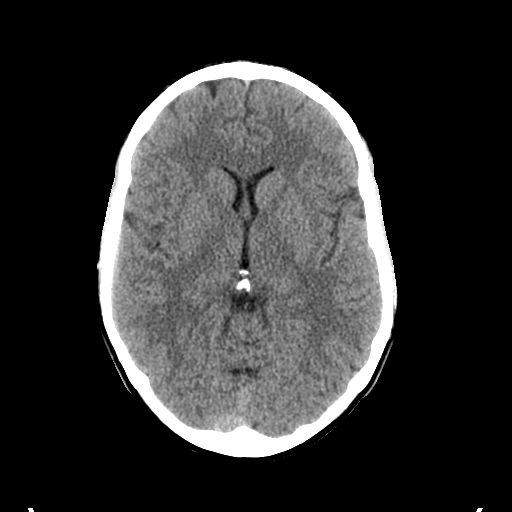
[im 17/29  brain]
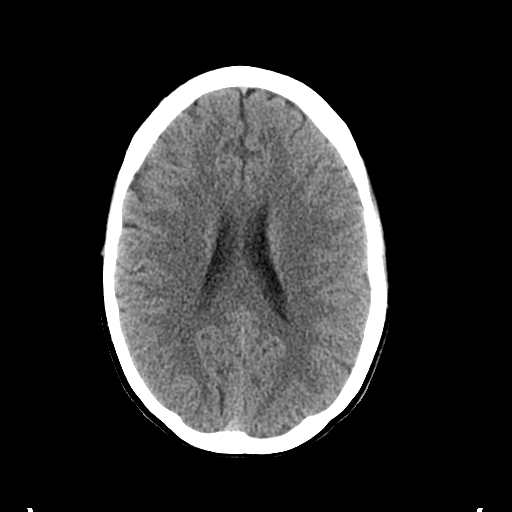
[im 17/29  bone]
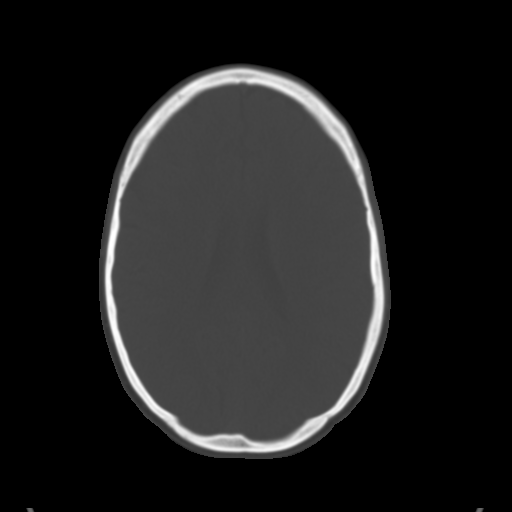
[im 20/29  brain]
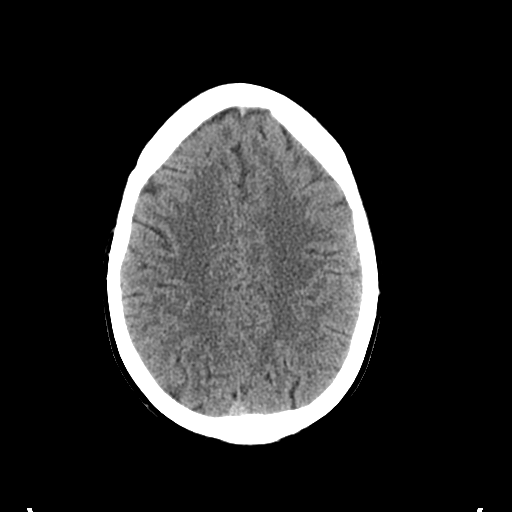
[im 23/29  brain]
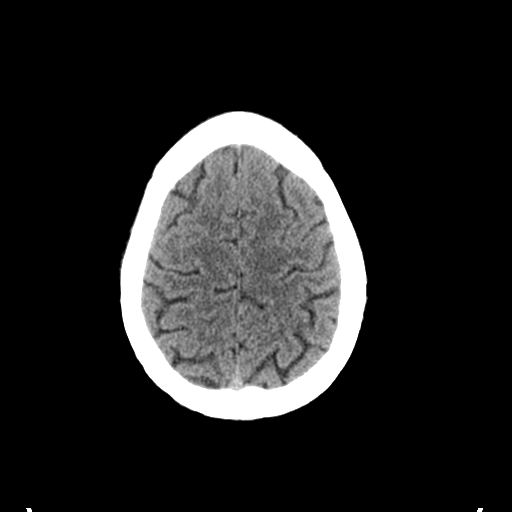
[im 27/29  brain]
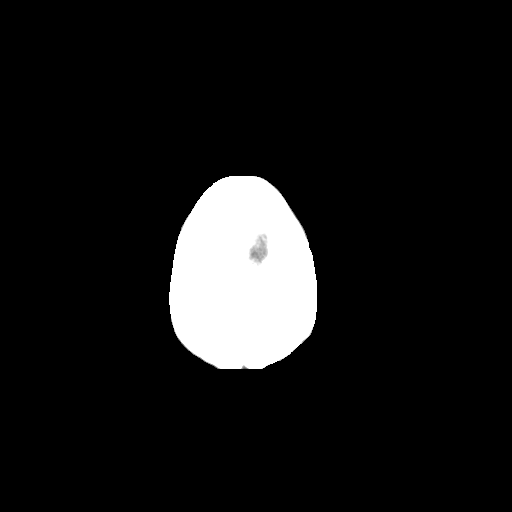

[Series 4: coronal soft tissue · coronal · 0.29mm/px · 3 of 80 slices shown]
[im 27/80  brain]
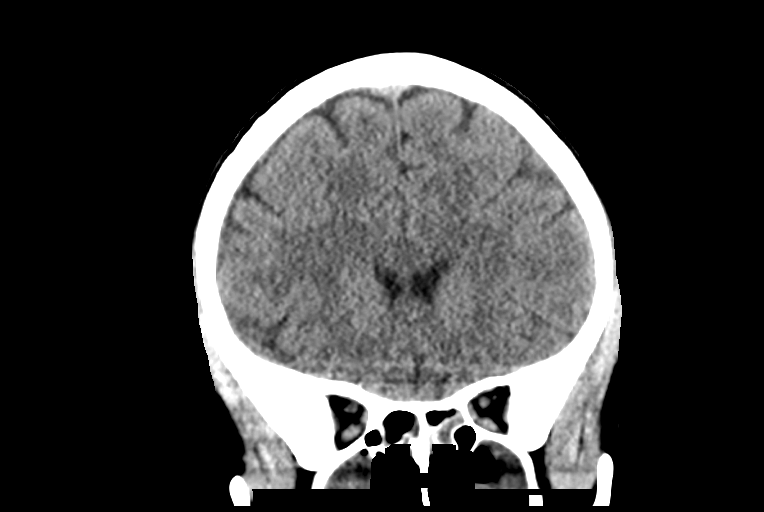
[im 36/80  brain]
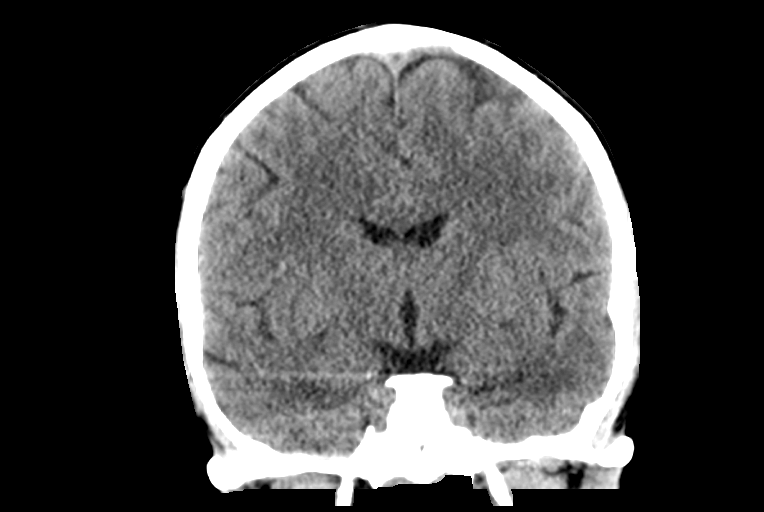
[im 44/80  brain]
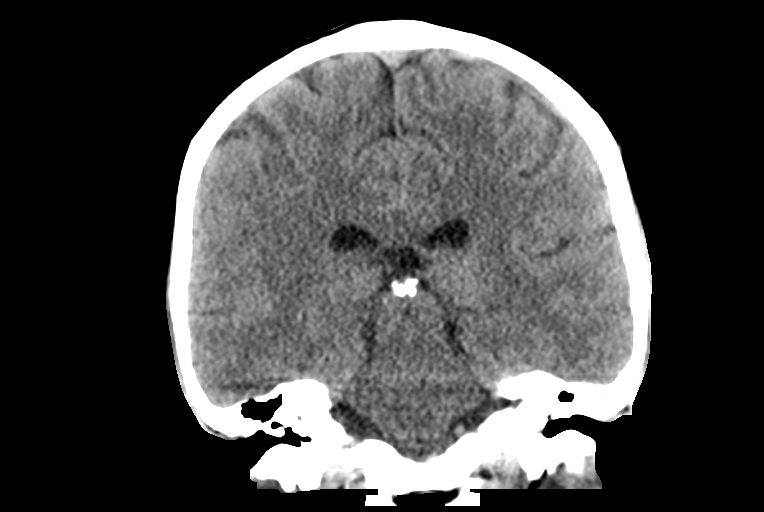

[Series 5: sagittal soft tissue · sagittal · 0.28mm/px · 3 of 84 slices shown]
[im 28/84  brain]
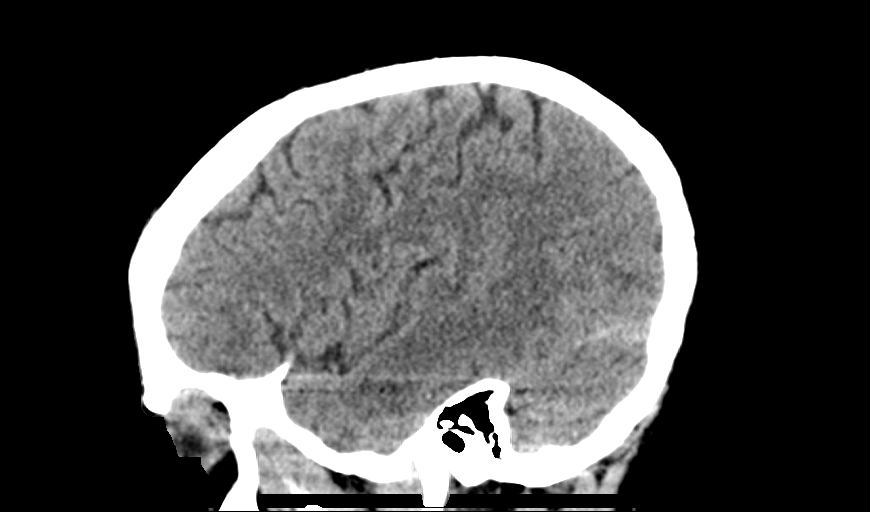
[im 42/84  brain]
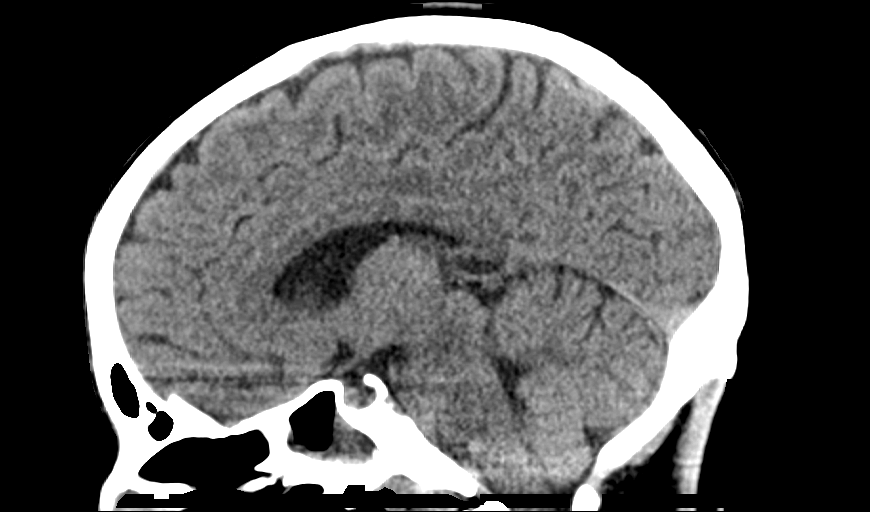
[im 56/84  brain]
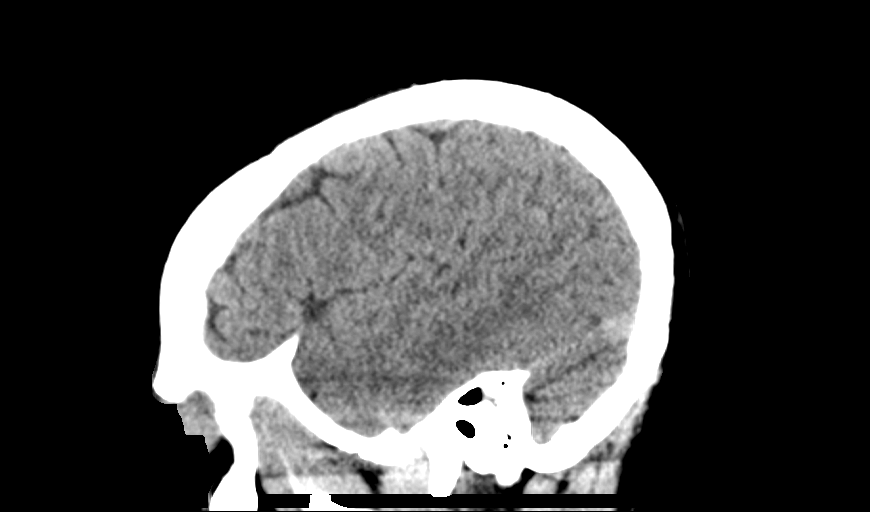

[14 of 46 positions shown; findings below may reference images not displayed]

FINDINGS: Brain:

No evidence of large-territorial acute infarction. No parenchymal
hemorrhage. No mass lesion. No extra-axial collection.

No mass effect or midline shift. No hydrocephalus. Basilar cisterns
are patent.

Vascular: No hyperdense vessel.

Skull: No acute fracture or focal lesion.

Sinuses/Orbits: Almost complete opacification of bilateral
maxillary, sphenoid, ethmoid sinuses. Almost complete opacification
of left mastoid air cells with question of left middle ear fluid.
Paranasal sinuses and mastoid air cells are clear. The orbits are
unremarkable.

Other: None.
IMPRESSION: 1. Almost complete opacification of bilateral maxillary, sphenoid,
ethmoid sinuses. Almost complete opacification of left mastoid air
cells with question of left middle ear fluid.
2. No acute intracranial abnormality.

## 2022-06-12 DIAGNOSIS — Z419 Encounter for procedure for purposes other than remedying health state, unspecified: Secondary | ICD-10-CM | POA: Diagnosis not present

## 2022-07-13 DIAGNOSIS — Z419 Encounter for procedure for purposes other than remedying health state, unspecified: Secondary | ICD-10-CM | POA: Diagnosis not present

## 2022-08-11 DIAGNOSIS — Z419 Encounter for procedure for purposes other than remedying health state, unspecified: Secondary | ICD-10-CM | POA: Diagnosis not present

## 2022-09-11 DIAGNOSIS — Z419 Encounter for procedure for purposes other than remedying health state, unspecified: Secondary | ICD-10-CM | POA: Diagnosis not present

## 2022-10-11 DIAGNOSIS — Z419 Encounter for procedure for purposes other than remedying health state, unspecified: Secondary | ICD-10-CM | POA: Diagnosis not present

## 2022-11-11 DIAGNOSIS — Z419 Encounter for procedure for purposes other than remedying health state, unspecified: Secondary | ICD-10-CM | POA: Diagnosis not present

## 2022-11-11 IMAGING — CR DG CERVICAL SPINE COMPLETE 4+V
1 series · 7 of 7 positions shown · non-contrast
Comparison: No prior radiographs, correlation is made with CT neck
03/20/2019

CLINICAL DATA: Neck pain

EXAM:
CERVICAL SPINE - COMPLETE 4+ VIEW

[Series 1: dg cervical spine complete · 0.14mm/px · 7 of 7 slices shown]
[im 1/7]
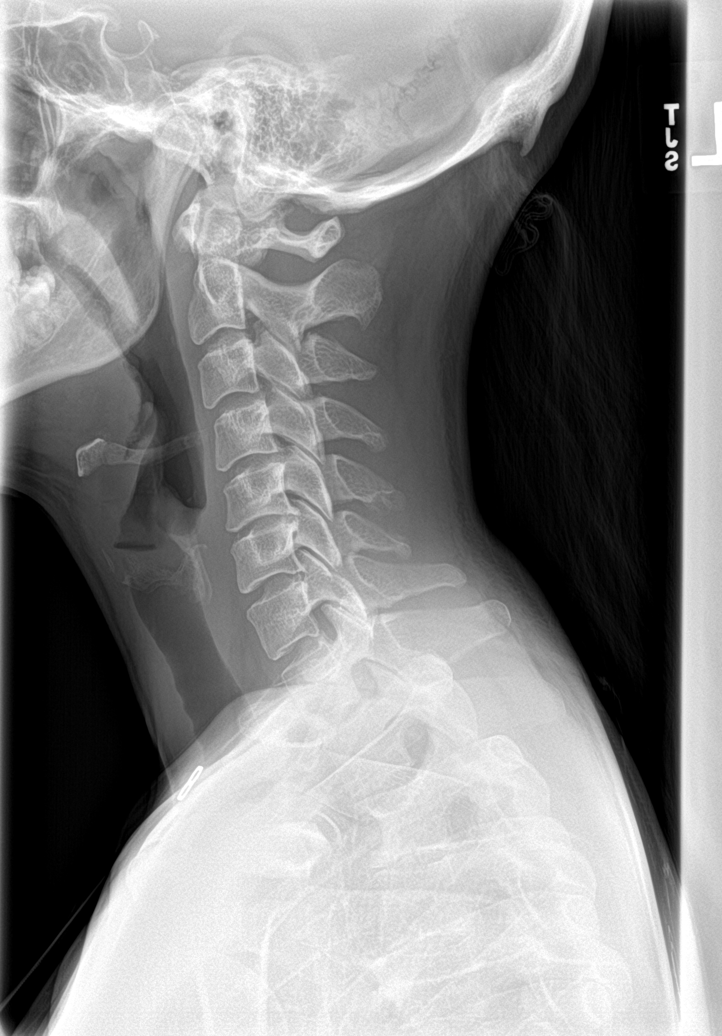
[im 2/7]
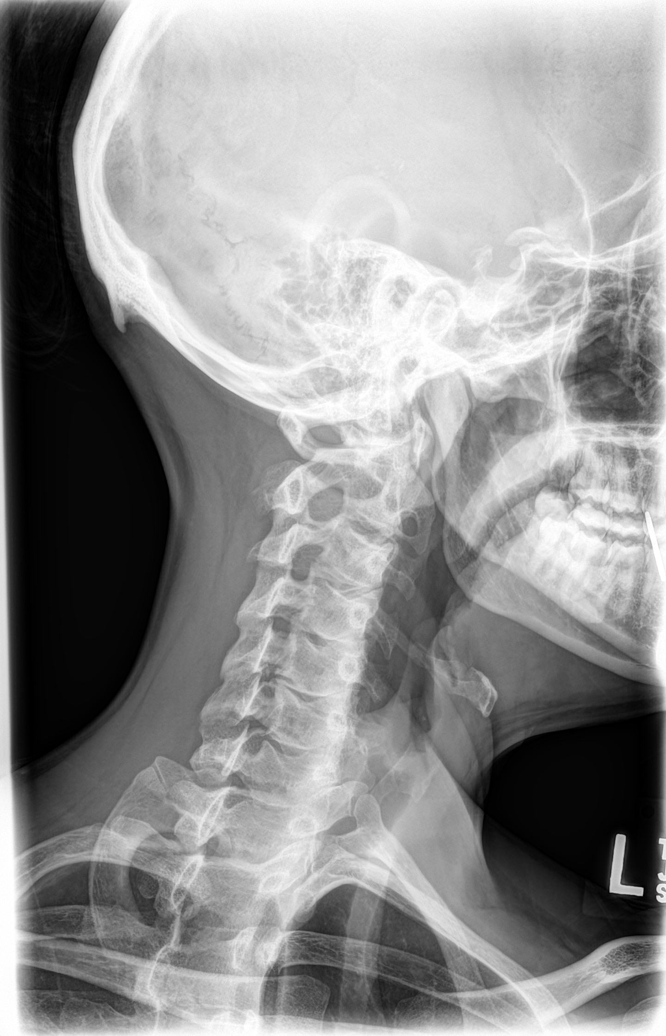
[im 3/7]
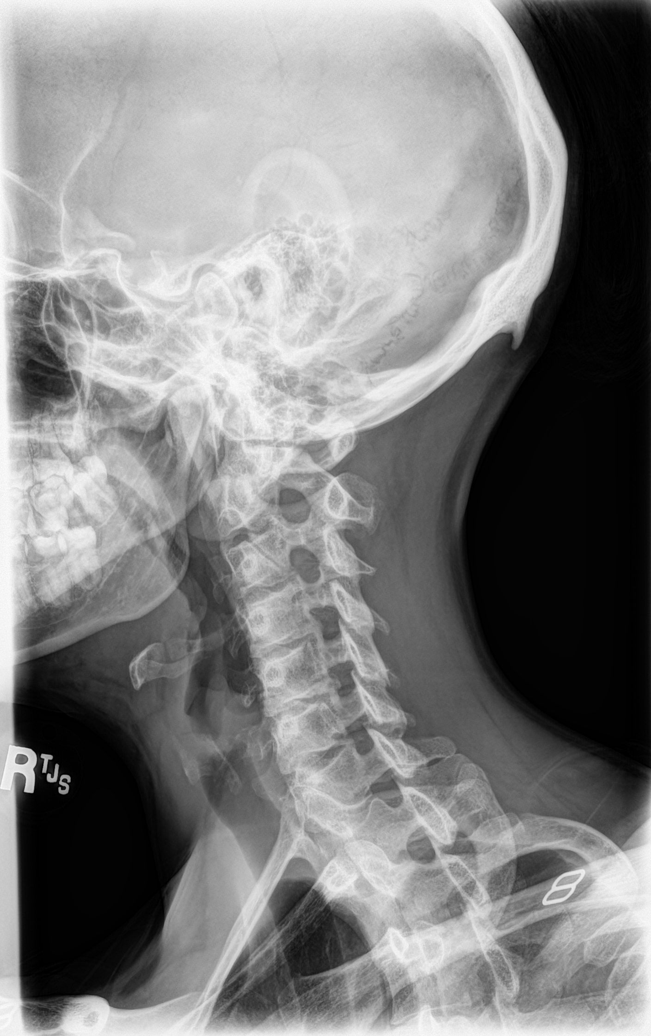
[im 4/7]
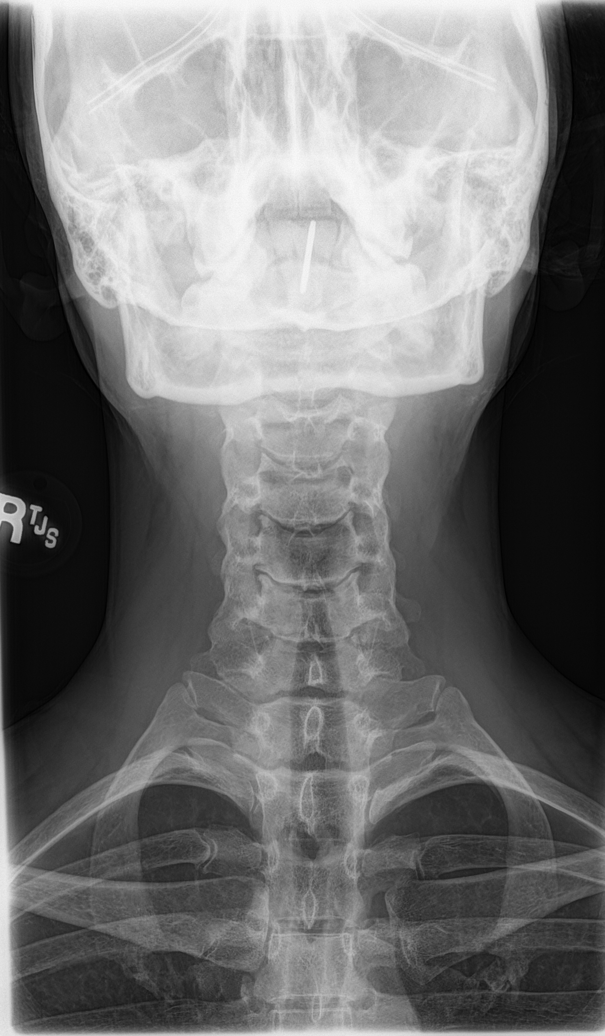
[im 5/7]
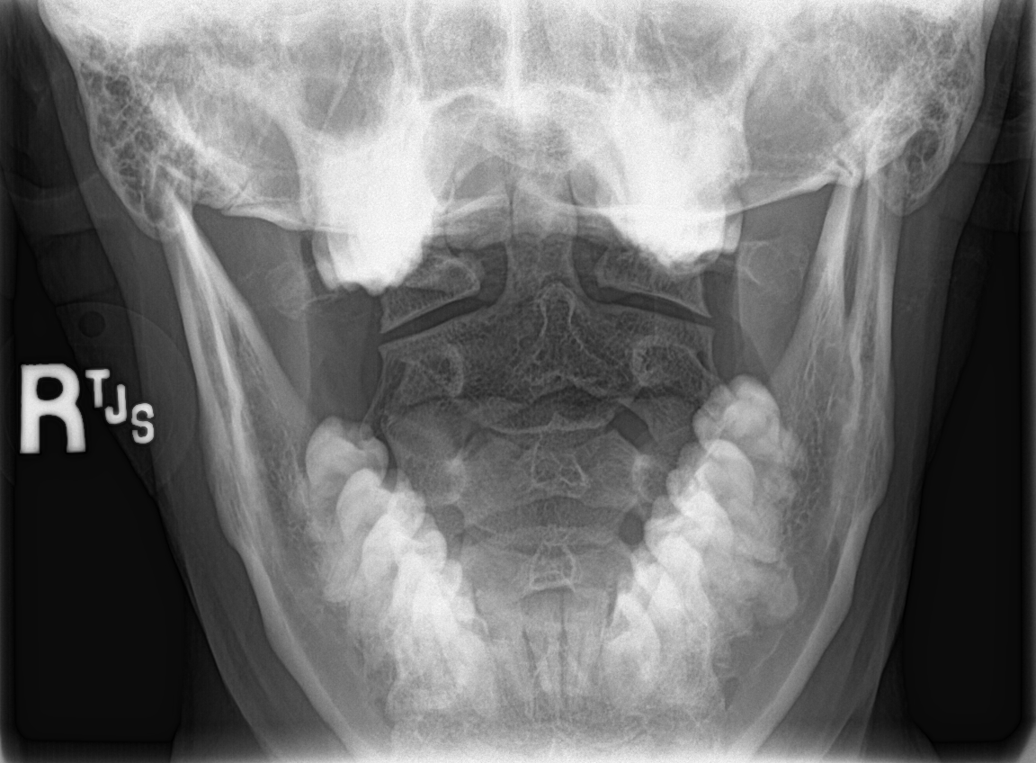
[im 6/7]
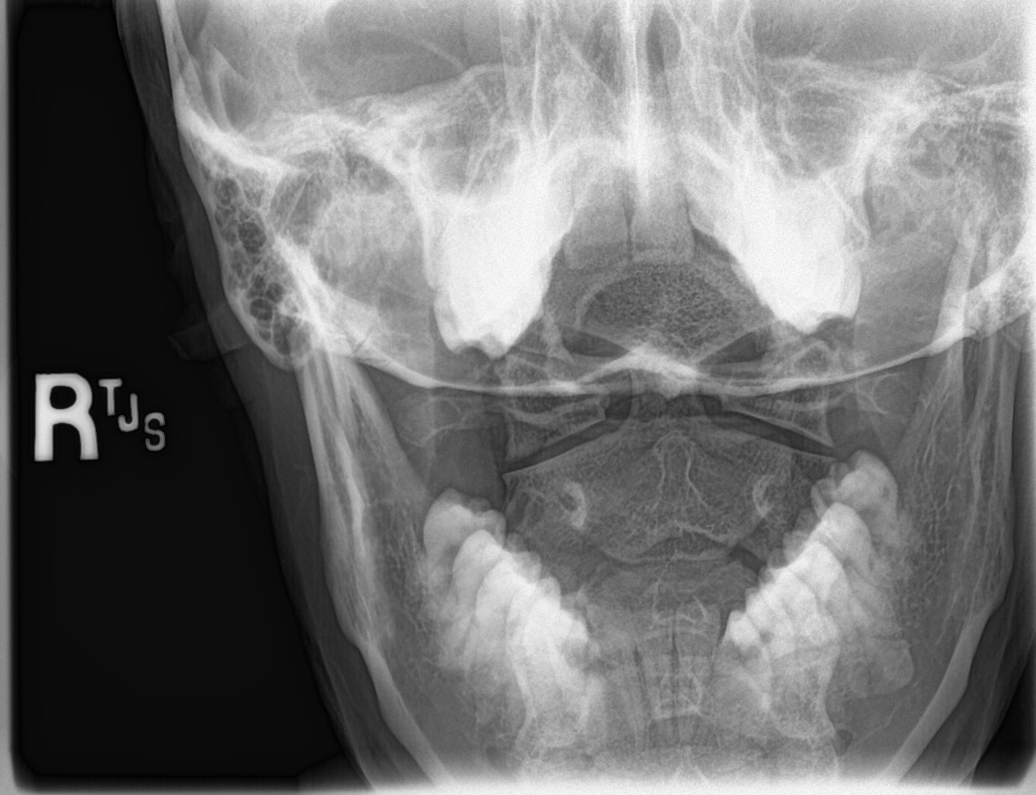
[im 7/7]
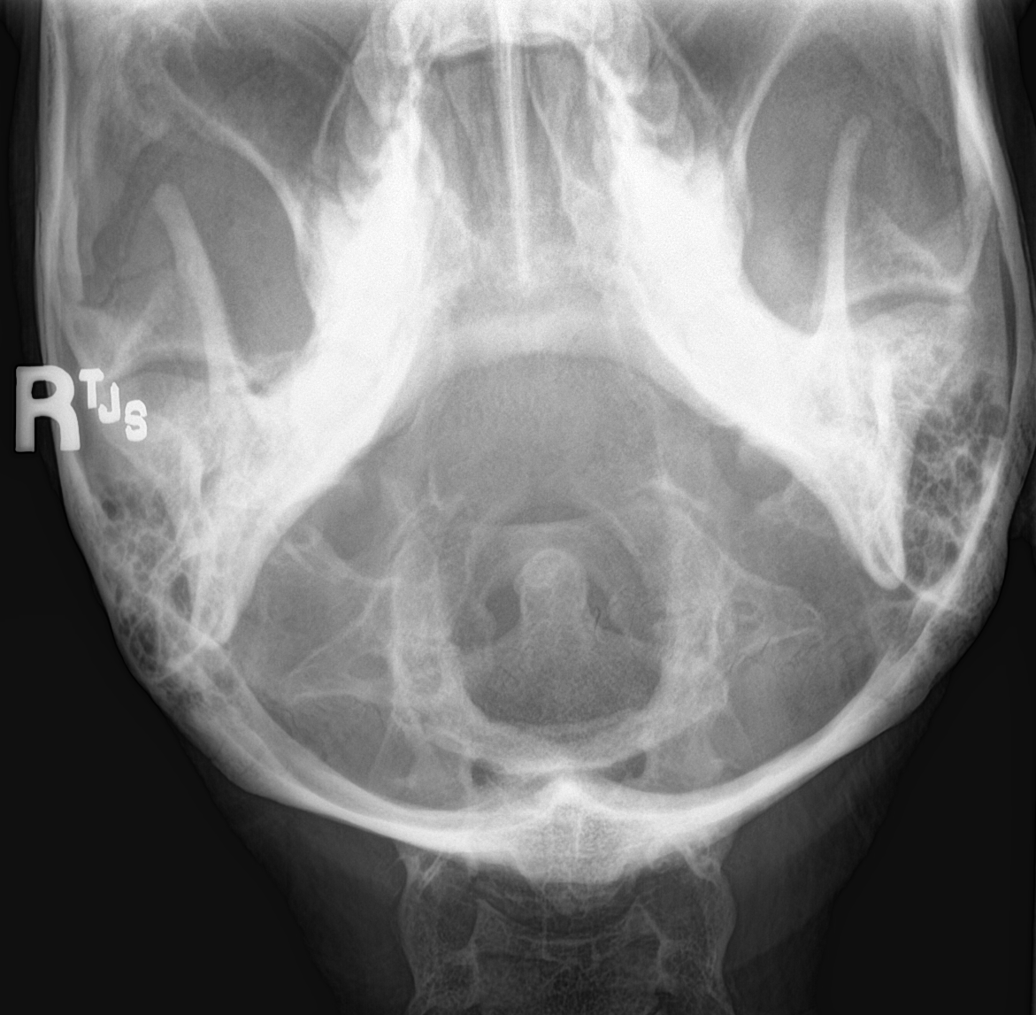

[7 of 7 positions shown; findings below may reference images not displayed]

FINDINGS: There is no evidence of cervical spine fracture or prevertebral soft
tissue swelling. Straightening of the normal cervical lordosis. No
other significant bone abnormalities are identified.
IMPRESSION: Negative cervical spine radiographs.

## 2022-12-11 DIAGNOSIS — Z419 Encounter for procedure for purposes other than remedying health state, unspecified: Secondary | ICD-10-CM | POA: Diagnosis not present

## 2023-01-11 DIAGNOSIS — Z419 Encounter for procedure for purposes other than remedying health state, unspecified: Secondary | ICD-10-CM | POA: Diagnosis not present

## 2023-02-11 DIAGNOSIS — Z419 Encounter for procedure for purposes other than remedying health state, unspecified: Secondary | ICD-10-CM | POA: Diagnosis not present

## 2023-03-13 DIAGNOSIS — Z419 Encounter for procedure for purposes other than remedying health state, unspecified: Secondary | ICD-10-CM | POA: Diagnosis not present

## 2023-04-13 DIAGNOSIS — Z419 Encounter for procedure for purposes other than remedying health state, unspecified: Secondary | ICD-10-CM | POA: Diagnosis not present

## 2023-05-13 DIAGNOSIS — Z419 Encounter for procedure for purposes other than remedying health state, unspecified: Secondary | ICD-10-CM | POA: Diagnosis not present

## 2023-06-13 DIAGNOSIS — Z419 Encounter for procedure for purposes other than remedying health state, unspecified: Secondary | ICD-10-CM | POA: Diagnosis not present

## 2023-07-14 DIAGNOSIS — Z419 Encounter for procedure for purposes other than remedying health state, unspecified: Secondary | ICD-10-CM | POA: Diagnosis not present

## 2023-08-11 DIAGNOSIS — Z419 Encounter for procedure for purposes other than remedying health state, unspecified: Secondary | ICD-10-CM | POA: Diagnosis not present

## 2023-09-22 DIAGNOSIS — Z419 Encounter for procedure for purposes other than remedying health state, unspecified: Secondary | ICD-10-CM | POA: Diagnosis not present

## 2023-10-22 DIAGNOSIS — Z419 Encounter for procedure for purposes other than remedying health state, unspecified: Secondary | ICD-10-CM | POA: Diagnosis not present

## 2023-11-22 DIAGNOSIS — Z419 Encounter for procedure for purposes other than remedying health state, unspecified: Secondary | ICD-10-CM | POA: Diagnosis not present

## 2023-12-18 ENCOUNTER — Ambulatory Visit (HOSPITAL_BASED_OUTPATIENT_CLINIC_OR_DEPARTMENT_OTHER): Payer: Self-pay | Admitting: Physician Assistant

## 2023-12-22 DIAGNOSIS — Z419 Encounter for procedure for purposes other than remedying health state, unspecified: Secondary | ICD-10-CM | POA: Diagnosis not present

## 2024-01-22 DIAGNOSIS — Z419 Encounter for procedure for purposes other than remedying health state, unspecified: Secondary | ICD-10-CM | POA: Diagnosis not present

## 2024-02-22 DIAGNOSIS — Z419 Encounter for procedure for purposes other than remedying health state, unspecified: Secondary | ICD-10-CM | POA: Diagnosis not present
